# Patient Record
Sex: Male | Born: 1980 | Race: White | Hispanic: No | Marital: Single | Smoking: Never smoker
Health system: Southern US, Community
[De-identification: ages and names within clinical notes are randomized; demographics above are authoritative.]

## PROBLEM LIST (undated history)

## (undated) DIAGNOSIS — E039 Hypothyroidism, unspecified: Secondary | ICD-10-CM

## (undated) DIAGNOSIS — E041 Nontoxic single thyroid nodule: Secondary | ICD-10-CM

## (undated) DIAGNOSIS — Z8619 Personal history of other infectious and parasitic diseases: Secondary | ICD-10-CM

## (undated) DIAGNOSIS — E785 Hyperlipidemia, unspecified: Secondary | ICD-10-CM

## (undated) DIAGNOSIS — T4145XA Adverse effect of unspecified anesthetic, initial encounter: Secondary | ICD-10-CM

## (undated) DIAGNOSIS — E119 Type 2 diabetes mellitus without complications: Secondary | ICD-10-CM

## (undated) DIAGNOSIS — T8859XA Other complications of anesthesia, initial encounter: Secondary | ICD-10-CM

## (undated) DIAGNOSIS — F419 Anxiety disorder, unspecified: Secondary | ICD-10-CM

## (undated) HISTORY — DX: Personal history of other infectious and parasitic diseases: Z86.19

## (undated) HISTORY — DX: Hyperlipidemia, unspecified: E78.5

## (undated) HISTORY — DX: Hypothyroidism, unspecified: E03.9

## (undated) HISTORY — DX: Type 2 diabetes mellitus without complications: E11.9

## (undated) HISTORY — DX: Nontoxic single thyroid nodule: E04.1

---

## 2013-05-05 ENCOUNTER — Ambulatory Visit (INDEPENDENT_AMBULATORY_CARE_PROVIDER_SITE_OTHER): Payer: Managed Care, Other (non HMO) | Admitting: Adult Health

## 2013-05-05 ENCOUNTER — Encounter: Payer: Self-pay | Admitting: Adult Health

## 2013-05-05 ENCOUNTER — Encounter (INDEPENDENT_AMBULATORY_CARE_PROVIDER_SITE_OTHER): Payer: Self-pay

## 2013-05-05 VITALS — BP 106/86 | HR 88 | Temp 98.1°F | Resp 14 | Ht 67.9 in | Wt 213.0 lb

## 2013-05-05 DIAGNOSIS — E785 Hyperlipidemia, unspecified: Secondary | ICD-10-CM

## 2013-05-05 DIAGNOSIS — Z0000 Encounter for general adult medical examination without abnormal findings: Secondary | ICD-10-CM

## 2013-05-05 DIAGNOSIS — R7309 Other abnormal glucose: Secondary | ICD-10-CM

## 2013-05-05 DIAGNOSIS — E039 Hypothyroidism, unspecified: Secondary | ICD-10-CM

## 2013-05-05 DIAGNOSIS — Z8639 Personal history of other endocrine, nutritional and metabolic disease: Secondary | ICD-10-CM | POA: Insufficient documentation

## 2013-05-05 HISTORY — DX: Hyperlipidemia, unspecified: E78.5

## 2013-05-05 LAB — CBC WITH DIFFERENTIAL/PLATELET
BASOS PCT: 0.6 % (ref 0.0–3.0)
Basophils Absolute: 0 10*3/uL (ref 0.0–0.1)
EOS ABS: 0.1 10*3/uL (ref 0.0–0.7)
Eosinophils Relative: 2.2 % (ref 0.0–5.0)
HCT: 46.1 % (ref 39.0–52.0)
Hemoglobin: 15.7 g/dL (ref 13.0–17.0)
LYMPHS ABS: 2.2 10*3/uL (ref 0.7–4.0)
Lymphocytes Relative: 32.5 % (ref 12.0–46.0)
MCHC: 34.1 g/dL (ref 30.0–36.0)
MCV: 87.3 fl (ref 78.0–100.0)
Monocytes Absolute: 0.5 10*3/uL (ref 0.1–1.0)
Monocytes Relative: 8 % (ref 3.0–12.0)
NEUTROS ABS: 3.9 10*3/uL (ref 1.4–7.7)
NEUTROS PCT: 56.7 % (ref 43.0–77.0)
Platelets: 252 10*3/uL (ref 150.0–400.0)
RBC: 5.28 Mil/uL (ref 4.22–5.81)
RDW: 12.3 % (ref 11.5–14.6)
WBC: 6.9 10*3/uL (ref 4.5–10.5)

## 2013-05-05 LAB — HEPATIC FUNCTION PANEL
ALBUMIN: 4.5 g/dL (ref 3.5–5.2)
ALK PHOS: 70 U/L (ref 39–117)
ALT: 121 U/L — ABNORMAL HIGH (ref 0–53)
AST: 69 U/L — AB (ref 0–37)
BILIRUBIN DIRECT: 0.2 mg/dL (ref 0.0–0.3)
TOTAL PROTEIN: 7.2 g/dL (ref 6.0–8.3)
Total Bilirubin: 1.3 mg/dL — ABNORMAL HIGH (ref 0.3–1.2)

## 2013-05-05 LAB — LIPID PANEL
CHOLESTEROL: 232 mg/dL — AB (ref 0–200)
HDL: 36.1 mg/dL — ABNORMAL LOW (ref >39.00)
LDL Cholesterol: 149 mg/dL — ABNORMAL HIGH (ref 0–99)
Total CHOL/HDL Ratio: 6
Triglycerides: 236 mg/dL — ABNORMAL HIGH (ref 0.0–149.0)
VLDL: 47.2 mg/dL — AB (ref 0.0–40.0)

## 2013-05-05 LAB — BASIC METABOLIC PANEL
BUN: 19 mg/dL (ref 6–23)
CHLORIDE: 102 meq/L (ref 96–112)
CO2: 27 mEq/L (ref 19–32)
Calcium: 10.3 mg/dL (ref 8.4–10.5)
Creatinine, Ser: 0.9 mg/dL (ref 0.4–1.5)
GFR: 108.92 mL/min (ref >60.00)
Glucose, Bld: 132 mg/dL — ABNORMAL HIGH (ref 70–99)
Potassium: 4.1 mEq/L (ref 3.5–5.1)
SODIUM: 137 meq/L (ref 135–145)

## 2013-05-05 LAB — TSH: TSH: 1.94 u[IU]/mL (ref 0.35–5.50)

## 2013-05-05 LAB — HEMOGLOBIN A1C: Hgb A1c MFr Bld: 9.6 % — ABNORMAL HIGH (ref 4.6–6.5)

## 2013-05-05 LAB — FOLATE: Folate: >24.8 ng/mL (ref >5.9)

## 2013-05-05 LAB — VITAMIN B12: Vitamin B-12: 403 pg/mL (ref 211–911)

## 2013-05-05 NOTE — Progress Notes (Signed)
Pre visit review using our clinic review tool, if applicable. No additional management support is needed unless otherwise documented below in the visit note. 

## 2013-05-05 NOTE — Progress Notes (Signed)
Patient ID: Eric Hodges, male   DOB: [birth year], 33 y.o.   MRN: [id number]   Subjective:    Patient ID: Eric Hodges, male    DOB: 16-Aug-1980, 33 y.o.   MRN: [id number]  HPI  Pt is a pleasant 33 y/o male who presents to clinic to establish care. He recently moved to this area from [facility]. He reports history of hypothyroidism although he has not taken his medication in quite some time. Also reports history of thyroid nodule last evaluated in August of 2014 with recommendation to evaluate yearly. Previous prescription for levothyroxine was 50 mcg daily. 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 Recently, he is concerned about his blood sugar levels. Reports that when he started working at [employer] they offered a free screening and he found out that his A1c was elevated. Does not recall what the levels were. He has been dieting and exercising. He is fasting to have blood work checked. Also has history of elevated cholesterol in the past.     Past Medical History  Diagnosis Date  . Hypothyroidism   . Depression     Situational - death of father in May 12, 2006. Resolved  . History of chicken pox   . Thyroid nodule     Evaluate yearly - due 08/2013     History reviewed. No pertinent past surgical history.   Family History  Problem Relation Age of Onset  . Hyperlipidemia Mother   . Hypertension Mother      History   Social History  . Marital Status: Single    Spouse Name: N/A    Number of Children: N/A  . Years of Education: N/A   Occupational History  . Not on file.   Social History Main Topics  . Smoking status: Never Smoker   . Smokeless tobacco: Not on file  . Alcohol Use: Yes     Comment: Occasionally  . Drug Use: No  . Sexual Activity: Not on file   Other Topics Concern  . Not on file   Social History Narrative  . No narrative on file     Review of Systems  Constitutional: Negative.   HENT: Negative.   Eyes:  Negative.   Respiratory: Negative.   Cardiovascular: Negative.   Gastrointestinal: Negative.   Endocrine: Negative.   Genitourinary: Negative.   Musculoskeletal: Negative.   Skin: Negative.   Allergic/Immunologic: Negative.   Neurological: Negative.   Hematological: Negative.   Psychiatric/Behavioral: Negative.        Objective:  BP 106/86  Pulse 88  Temp(Src) 98.1 F (36.7 C)  Resp 14  Ht 5' 7.9" (1.725 m)  Wt 213 lb (96.616 kg)  BMI 32.47 kg/m2  SpO2 98%     Physical Exam  Constitutional: He is oriented to person, place, and time. He appears well-developed and well-nourished. No distress.  HENT:  Head: Normocephalic and atraumatic.  Right Ear: External ear normal.  Left Ear: External ear normal.  Nose: Nose normal.  Mouth/Throat: Oropharynx is clear and moist.  Eyes: Conjunctivae and EOM are normal. Pupils are equal, round, and reactive to light.  Neck: Normal range of motion. Neck supple. No tracheal deviation present. No thyromegaly present.  Cardiovascular: Normal rate, regular rhythm, normal heart sounds and intact distal pulses.  Exam reveals no gallop and no friction rub.   No murmur heard. Pulmonary/Chest: Effort normal and breath sounds normal. No respiratory distress. He has no wheezes. He has no rales.  Abdominal: Soft. Bowel sounds  are normal. He exhibits no distension and no mass. There is no tenderness. There is no rebound and no guarding.  Musculoskeletal: Normal range of motion. He exhibits no edema and no tenderness.  Lymphadenopathy:    He has no cervical adenopathy.  Neurological: He is alert and oriented to person, place, and time. He has normal reflexes. No cranial nerve deficit. Coordination normal.  Skin: Skin is warm and dry.  Psychiatric: He has a normal mood and affect. His behavior is normal. Judgment and thought content normal.      Assessment & Plan:   1. Routine general medical examination at a health care facility Normal physical  exam. Check routine labs. Request medical records from previous PCP - Basic metabolic panel - CBC with Differential - Vit D  25 hydroxy (rtn osteoporosis monitoring) - Vitamin B12 - Folate  2. Hypothyroidism Has not been taking his levothyroxine. Check levels. Restart medication.  Hx of thyroid nodule which will need re-evaluation in August 2015.  - TSH  3. HLD (hyperlipidemia) Hx of elevated cholesterol. Check lipids and liver function. Continue to follow - Hepatic function panel - Lipid panel  4. Elevated blood glucose level Check hemoglobin A1c.

## 2013-05-06 LAB — VITAMIN D 25 HYDROXY (VIT D DEFICIENCY, FRACTURES): Vit D, 25-Hydroxy: 23 ng/mL — ABNORMAL LOW (ref 30–89)

## 2013-05-08 ENCOUNTER — Encounter: Payer: Self-pay | Admitting: Adult Health

## 2013-05-08 ENCOUNTER — Other Ambulatory Visit: Payer: Self-pay | Admitting: Adult Health

## 2013-05-08 DIAGNOSIS — R748 Abnormal levels of other serum enzymes: Secondary | ICD-10-CM

## 2013-05-08 MED ORDER — METFORMIN HCL 500 MG PO TABS: ORAL_TABLET | ORAL | Status: DC

## 2013-05-08 MED ORDER — LINAGLIPTIN 5 MG PO TABS: 5.0000 mg | ORAL_TABLET | Freq: Every day | ORAL | Status: DC

## 2013-05-11 ENCOUNTER — Telehealth: Payer: Self-pay | Admitting: Adult Health

## 2013-05-11 NOTE — Telephone Encounter (Signed)
I would encourage him to stay on the metformin. It will also help with weight loss. I want him to return to see me 1 month from his last visit. He needs to have an ultrasound and that way I should have the result to discuss. I also want to recheck his liver panel and glucose level after he has exercised and dieted for 1 month.

## 2013-05-11 NOTE — Telephone Encounter (Signed)
Pt came in to office for glucometer.  Pt had to make nurse visit appt, will come 5/8.  Pt states he was prescribed medication that he has stopped taking and would like to speak with R. Rey about.  Wants to be sure it is okay that he has stopped taking the metformin for now and would like to see what results he gets without the medication.  States he  has lost 12 pounds

## 2013-05-12 NOTE — Telephone Encounter (Signed)
Called patient no answer voicemail left to have him call the office at his earliest convenience to discuss medication.

## 2013-05-12 NOTE — Telephone Encounter (Signed)
Pt actually had not started taking the Metformin yet. He states that he has changed his diet and exercise a lot since last visit, has already lost 12 lbs. Would like to hold off on starting Metformin until after he comes in for his nurse visit and gets his glucometer so he can be checking his sugars at home and seeing his process. Pt is on the phone with Lorriane Shire now scheduling his ultrasound for tomorrow. Pt is aware he needs a 1 month followup with [facility], has not scheduled yet and declined to schedule until after he has his ultrasound done.

## 2013-05-13 ENCOUNTER — Ambulatory Visit: Payer: Self-pay | Admitting: Adult Health

## 2013-05-15 ENCOUNTER — Telehealth: Payer: Self-pay | Admitting: Adult Health

## 2013-05-15 NOTE — Telephone Encounter (Signed)
Results of abdominal ultrasound

## 2013-05-20 ENCOUNTER — Ambulatory Visit (INDEPENDENT_AMBULATORY_CARE_PROVIDER_SITE_OTHER): Payer: Managed Care, Other (non HMO) | Admitting: Adult Health

## 2013-05-20 ENCOUNTER — Ambulatory Visit: Payer: Managed Care, Other (non HMO)

## 2013-05-20 ENCOUNTER — Encounter: Payer: Self-pay | Admitting: Adult Health

## 2013-05-20 VITALS — BP 104/76 | HR 72 | Temp 98.2°F | Resp 12 | Wt 202.8 lb

## 2013-05-20 DIAGNOSIS — E119 Type 2 diabetes mellitus without complications: Secondary | ICD-10-CM

## 2013-05-20 MED ORDER — LINAGLIPTIN 5 MG PO TABS: 5.0000 mg | ORAL_TABLET | Freq: Every day | ORAL | Status: DC

## 2013-05-20 MED ORDER — METFORMIN HCL 500 MG PO TABS
ORAL_TABLET | ORAL | Status: DC
Start: 2013-05-20 — End: 2014-06-04

## 2013-05-20 NOTE — Progress Notes (Signed)
   Subjective:    Patient ID: Eric Hodges, male    DOB: [birth year], 33 y.o.   MRN: [id number]  HPI Pt is a pleasant 33 year old male newly diagnosed with diabetes. I had prescribed metformin 500 mg twice a day and Tradjenta when diagnosed 2 weeks ago. He has not started any of his medications on account that he wanted to discuss these with me further. He had been asked to come to clinic today for instructions on how to use his glucometer as well as to discuss diet and exercise. Patient has been doing a lot of diabetic research on his own. He does not feel he needs any instruction on diet. He has been exercising every day on his elliptical machine. Patient has lost 16 pounds. He was congratulated on this.   Past Medical History  Diagnosis Date  . Hypothyroidism   . Depression     Situational - death of father in 04/30/06. Resolved  . History of chicken pox   . Thyroid nodule     Evaluate yearly - due 08/2013    Review of Systems  Constitutional: Negative.   HENT: Negative.   Eyes: Negative.   Respiratory: Negative.   Cardiovascular: Negative.   Gastrointestinal: Negative.   Endocrine: Negative.   Genitourinary: Negative.   Musculoskeletal: Negative.   Skin: Negative.   Allergic/Immunologic: Negative.   Neurological: Negative.   Hematological: Negative.   Psychiatric/Behavioral: Negative.        Objective:   Physical Exam  Constitutional: He is oriented to person, place, and time. He appears well-developed and well-nourished. No distress.  HENT:  Head: Normocephalic and atraumatic.  Eyes: Conjunctivae and EOM are normal.  Neck: Neck supple.  Cardiovascular: Normal rate and regular rhythm.   Pulmonary/Chest: Effort normal. No respiratory distress.  Musculoskeletal: Normal range of motion.  Neurological: He is alert and oriented to person, place, and time. Coordination normal.  Skin: Skin is warm and dry.  Psychiatric: He has a normal mood and affect. His behavior is normal.  Judgment and thought content normal.      Assessment & Plan:   1. Diabetes mellitus, type 2 Discussed medications at length with pt. He had not started his medication because he was trying to lower his blood sugar level "on his own with diet and exercise". He was congratulated on the 16 lb weight loss and on his exercising regularly. I did explained that he will need medication now to help lower his HgbA1c. He may eventually be able to come off medication but we need to make sure we prevent complications from even starting. Discussed yearly eye and foot exam. Diet, as mentioned above, he feels comfortable with. After discussing dz process and controlling his levels he has agreed to start medication today. Follow up in 1 month. Great than 30 min were spent in face to face communication including education for this newly diagnosed problem.

## 2013-05-20 NOTE — Progress Notes (Signed)
Pre visit review using our clinic review tool, if applicable. No additional management support is needed unless otherwise documented below in the visit note. 

## 2013-05-23 ENCOUNTER — Telehealth: Payer: Self-pay | Admitting: *Deleted

## 2013-05-23 NOTE — Telephone Encounter (Signed)
PA request form for the Tradjenta placed in [facility] box

## 2013-05-26 ENCOUNTER — Telehealth: Payer: Self-pay | Admitting: Adult Health

## 2013-05-26 NOTE — Telephone Encounter (Signed)
Prior auth for tradjenta

## 2013-06-04 ENCOUNTER — Other Ambulatory Visit: Payer: Self-pay | Admitting: Adult Health

## 2013-06-04 MED ORDER — SAXAGLIPTIN HCL 2.5 MG PO TABS: 2.5000 mg | ORAL_TABLET | Freq: Every day | ORAL | Status: DC

## 2013-06-09 ENCOUNTER — Encounter: Payer: Self-pay | Admitting: Adult Health

## 2013-06-21 ENCOUNTER — Ambulatory Visit (INDEPENDENT_AMBULATORY_CARE_PROVIDER_SITE_OTHER): Payer: Managed Care, Other (non HMO) | Admitting: Adult Health

## 2013-06-21 ENCOUNTER — Encounter: Payer: Self-pay | Admitting: Adult Health

## 2013-06-21 VITALS — BP 110/78 | HR 71 | Temp 98.1°F | Resp 14 | Ht 67.0 in | Wt 191.0 lb

## 2013-06-21 DIAGNOSIS — E1165 Type 2 diabetes mellitus with hyperglycemia: Secondary | ICD-10-CM

## 2013-06-21 DIAGNOSIS — IMO0001 Reserved for inherently not codable concepts without codable children: Secondary | ICD-10-CM

## 2013-06-21 DIAGNOSIS — IMO0002 Reserved for concepts with insufficient information to code with codable children: Secondary | ICD-10-CM

## 2013-06-21 NOTE — Progress Notes (Signed)
Pre visit review using our clinic review tool, if applicable. No additional management support is needed unless otherwise documented below in the visit note. 

## 2013-06-21 NOTE — Progress Notes (Signed)
Patient ID: Eric Hodges, male   DOB: 1980/08/08, 33 y.o.   MRN: [id number]   Subjective:    Patient ID: Eric Hodges, male    DOB: [birth year], 33 y.o.   MRN: [id number]  HPI  Pt is a pleasant 34 year old male recently diagnosed with diabetes. Previous hemoglobin A1c 9.6%. He has been taking metformin 500 mg twice a day with meals. Reports diarrhea and stomach upset following a.m. dose. No problems with p.m. dose. Has not picked up onglyza. Insurance initially requested prior authorization on tradjenta and refused request. They would onglyza; however, it took so long that he got tired of going back and forth to the pharmacy.  He has been exercising every day. Watching his diet very closely. Limited carbohydrates. Has lost 30 pounds in 1.5 months. Overall, feeling very well.  Past Medical History  Diagnosis Date  . Hypothyroidism   . Depression     Situational - death of father in 05/03/06. Resolved  . History of chicken pox   . Thyroid nodule     Evaluate yearly - due 08/2013    Current Outpatient Prescriptions on File Prior to Visit  Medication Sig Dispense Refill  . HYDROcodone-acetaminophen (NORCO/VICODIN) 5-325 MG per tablet Take 1 tablet by mouth every 6 (six) hours as needed for moderate pain.      Marland Kitchen levothyroxine (SYNTHROID, LEVOTHROID) 50 MCG tablet Take 50 mcg by mouth daily before breakfast.      . metFORMIN (GLUCOPHAGE) 500 MG tablet Start taking 500 mg with breakfast for 1 week then increase to twice daily with breakfast and dinner  60 tablet  2  . saxagliptin HCl (ONGLYZA) 2.5 MG TABS tablet Take 1 tablet (2.5 mg total) by mouth daily.  30 tablet  6   No current facility-administered medications on file prior to visit.     Review of Systems  Constitutional: Negative.   HENT: Negative.   Eyes: Negative.   Respiratory: Negative.   Cardiovascular: Negative.   Gastrointestinal: Negative.   Endocrine: Negative.   Genitourinary: Negative.   Musculoskeletal: Negative.     Skin: Negative.   Allergic/Immunologic: Negative.   Neurological: Negative.   Hematological: Negative.   Psychiatric/Behavioral: Negative.        Objective:  BP 110/78  Pulse 71  Temp(Src) 98.1 F (36.7 C) (Oral)  Resp 14  Ht 5\' 7"  (1.702 m)  Wt 191 lb (86.637 kg)  BMI 29.91 kg/m2  SpO2 98%   Physical Exam  Constitutional: He is oriented to person, place, and time. He appears well-developed and well-nourished. No distress.  HENT:  Head: Normocephalic and atraumatic.  Eyes: Conjunctivae and EOM are normal.  Cardiovascular: Normal rate, regular rhythm, normal heart sounds and intact distal pulses.  Exam reveals no gallop and no friction rub.   No murmur heard. Pulmonary/Chest: Effort normal and breath sounds normal. No respiratory distress. He has no wheezes. He has no rales.  Musculoskeletal: Normal range of motion.  Neurological: He is alert and oriented to person, place, and time.  Skin: Skin is warm and dry.  Psychiatric: He has a normal mood and affect. His behavior is normal. Judgment and thought content normal.      Assessment & Plan:   1. Diabetes mellitus type II, uncontrolled Check a1c today. Suspect it is down from 9.6%. Congratulated him on 30 lb weight loss. He has been very diligent in controlling his diabetes. Continue to follow. Adjust meds as needed.  - Hemoglobin A1c

## 2013-06-22 ENCOUNTER — Encounter: Payer: Self-pay | Admitting: Adult Health

## 2013-06-22 LAB — HEMOGLOBIN A1C: HEMOGLOBIN A1C: 6.9 % — AB (ref 4.6–6.5)

## 2013-06-23 ENCOUNTER — Other Ambulatory Visit: Payer: Self-pay | Admitting: Adult Health

## 2013-06-23 ENCOUNTER — Telehealth: Payer: Self-pay | Admitting: *Deleted

## 2013-06-23 ENCOUNTER — Other Ambulatory Visit: Payer: Managed Care, Other (non HMO)

## 2013-06-23 DIAGNOSIS — R740 Nonspecific elevation of levels of transaminase and lactic acid dehydrogenase [LDH]: Principal | ICD-10-CM

## 2013-06-23 DIAGNOSIS — R7401 Elevation of levels of liver transaminase levels: Secondary | ICD-10-CM

## 2013-06-23 NOTE — Telephone Encounter (Signed)
Patient wantd to know when he should schedule a follow up appointment. Please advise

## 2013-06-23 NOTE — Telephone Encounter (Signed)
What labs and dx?  

## 2013-06-27 ENCOUNTER — Other Ambulatory Visit: Payer: Managed Care, Other (non HMO)

## 2013-06-28 ENCOUNTER — Other Ambulatory Visit (INDEPENDENT_AMBULATORY_CARE_PROVIDER_SITE_OTHER): Payer: Managed Care, Other (non HMO)

## 2013-06-28 DIAGNOSIS — R740 Nonspecific elevation of levels of transaminase and lactic acid dehydrogenase [LDH]: Secondary | ICD-10-CM

## 2013-06-28 DIAGNOSIS — R7401 Elevation of levels of liver transaminase levels: Secondary | ICD-10-CM

## 2013-06-29 ENCOUNTER — Other Ambulatory Visit: Payer: Managed Care, Other (non HMO)

## 2013-06-30 ENCOUNTER — Encounter: Payer: Self-pay | Admitting: Adult Health

## 2013-06-30 LAB — HEPATIC FUNCTION PANEL
ALBUMIN: 4.8 g/dL (ref 3.5–5.2)
ALT: 43 U/L (ref 0–53)
AST: 29 U/L (ref 0–37)
Alkaline Phosphatase: 61 U/L (ref 39–117)
Bilirubin, Direct: 0.2 mg/dL (ref 0.0–0.3)
TOTAL PROTEIN: 7.5 g/dL (ref 6.0–8.3)
Total Bilirubin: 0.9 mg/dL (ref 0.2–1.2)

## 2014-06-04 ENCOUNTER — Encounter
Admission: EM | Disposition: A | Discharge status: Home / Self Care | Payer: Self-pay | Source: Home / Self Care | Attending: Emergency Medicine

## 2014-06-04 ENCOUNTER — Emergency Department: Payer: 59

## 2014-06-04 ENCOUNTER — Observation Stay: Payer: 59 | Admitting: Anesthesiology

## 2014-06-04 ENCOUNTER — Observation Stay
Admission: EM | Admit: 2014-06-04 | Discharge: 2014-06-05 | Disposition: A | Discharge status: Home / Self Care | Payer: 59 | Attending: Surgery | Admitting: Surgery

## 2014-06-04 ENCOUNTER — Encounter: Payer: Self-pay | Admitting: Emergency Medicine

## 2014-06-04 DIAGNOSIS — K3580 Unspecified acute appendicitis: Secondary | ICD-10-CM

## 2014-06-04 DIAGNOSIS — K353 Acute appendicitis with localized peritonitis: Secondary | ICD-10-CM | POA: Diagnosis not present

## 2014-06-04 DIAGNOSIS — Z809 Family history of malignant neoplasm, unspecified: Secondary | ICD-10-CM | POA: Diagnosis not present

## 2014-06-04 DIAGNOSIS — Z833 Family history of diabetes mellitus: Secondary | ICD-10-CM | POA: Diagnosis not present

## 2014-06-04 DIAGNOSIS — Z8249 Family history of ischemic heart disease and other diseases of the circulatory system: Secondary | ICD-10-CM | POA: Insufficient documentation

## 2014-06-04 HISTORY — PX: APPENDECTOMY: SHX54

## 2014-06-04 HISTORY — PX: LAPAROSCOPIC APPENDECTOMY: SHX408

## 2014-06-04 LAB — CBC WITH DIFFERENTIAL/PLATELET
BASOS ABS: 0.1 10*3/uL (ref 0–0.1)
BASOS PCT: 1 %
EOS ABS: 0.1 10*3/uL (ref 0–0.7)
EOS PCT: 2 %
HCT: 45.6 % (ref 40.0–52.0)
Hemoglobin: 15.5 g/dL (ref 13.0–18.0)
LYMPHS ABS: 1.7 10*3/uL (ref 1.0–3.6)
Lymphocytes Relative: 22 %
MCH: 29.2 pg (ref 26.0–34.0)
MCHC: 34.1 g/dL (ref 32.0–36.0)
MCV: 85.7 fL (ref 80.0–100.0)
MONO ABS: 0.6 10*3/uL (ref 0.2–1.0)
Monocytes Relative: 8 %
NEUTROS ABS: 5.5 10*3/uL (ref 1.4–6.5)
NEUTROS PCT: 67 %
Platelets: 214 10*3/uL (ref 150–440)
RBC: 5.32 MIL/uL (ref 4.40–5.90)
RDW: 12.8 % (ref 11.5–14.5)
WBC: 8 10*3/uL (ref 3.8–10.6)

## 2014-06-04 LAB — URINALYSIS COMPLETE WITH MICROSCOPIC (ARMC ONLY)
BILIRUBIN URINE: NEGATIVE
Bacteria, UA: NONE SEEN
GLUCOSE, UA: NEGATIVE mg/dL
HGB URINE DIPSTICK: NEGATIVE
Ketones, ur: NEGATIVE mg/dL
LEUKOCYTES UA: NEGATIVE
Nitrite: NEGATIVE
PH: 5 (ref 5.0–8.0)
Protein, ur: NEGATIVE mg/dL
SPECIFIC GRAVITY, URINE: 1.021 (ref 1.005–1.030)
Squamous Epithelial / LPF: NONE SEEN

## 2014-06-04 LAB — COMPREHENSIVE METABOLIC PANEL
ALBUMIN: 4.8 g/dL (ref 3.5–5.0)
ALK PHOS: 61 U/L (ref 38–126)
ALT: 32 U/L (ref 17–63)
AST: 28 U/L (ref 15–41)
Anion gap: 7 (ref 5–15)
BUN: 15 mg/dL (ref 6–20)
CO2: 28 mmol/L (ref 22–32)
Calcium: 9.6 mg/dL (ref 8.9–10.3)
Chloride: 106 mmol/L (ref 101–111)
Creatinine, Ser: 0.93 mg/dL (ref 0.61–1.24)
GFR calc Af Amer: >60 mL/min (ref >60)
Glucose, Bld: 123 mg/dL — ABNORMAL HIGH (ref 65–99)
Potassium: 4.1 mmol/L (ref 3.5–5.1)
Sodium: 141 mmol/L (ref 135–145)
TOTAL PROTEIN: 7.9 g/dL (ref 6.5–8.1)
Total Bilirubin: 0.6 mg/dL (ref 0.3–1.2)

## 2014-06-04 LAB — TROPONIN I: Troponin I: <0.03 ng/mL (ref <0.031)

## 2014-06-04 LAB — LIPASE, BLOOD: LIPASE: 60 U/L — AB (ref 22–51)

## 2014-06-04 SURGERY — APPENDECTOMY, LAPAROSCOPIC
Anesthesia: General | Wound class: Contaminated

## 2014-06-04 MED ORDER — IOHEXOL 300 MG/ML  SOLN
100.0000 mL | Freq: Once | INTRAMUSCULAR | Status: AC | PRN
  Administered 2014-06-04: 100 mL via INTRAVENOUS

## 2014-06-04 MED ORDER — GLYCOPYRROLATE 0.2 MG/ML IJ SOLN
INTRAMUSCULAR | Status: DC | PRN
  Administered 2014-06-04: .8 mg via INTRAVENOUS

## 2014-06-04 MED ORDER — LIDOCAINE HCL (CARDIAC) 20 MG/ML IV SOLN
INTRAVENOUS | Status: DC | PRN
  Administered 2014-06-04: 60 mg via INTRAVENOUS

## 2014-06-04 MED ORDER — SODIUM CHLORIDE 0.9 % IR SOLN
Status: DC | PRN
  Administered 2014-06-04: 1000 mL

## 2014-06-04 MED ORDER — IOHEXOL 240 MG/ML SOLN: 25.0000 mL | Freq: Once | INTRAMUSCULAR | Status: DC | PRN

## 2014-06-04 MED ORDER — BUPIVACAINE HCL 0.25 % IJ SOLN
INTRAMUSCULAR | Status: DC | PRN
  Administered 2014-06-04: 30 mL

## 2014-06-04 MED ORDER — ERTAPENEM SODIUM 1 G IJ SOLR
1.0000 g | Freq: Once | INTRAMUSCULAR | Status: AC
  Administered 2014-06-04: 1 g via INTRAVENOUS
  Filled 2014-06-04: qty 1

## 2014-06-04 MED ORDER — FENTANYL CITRATE (PF) 100 MCG/2ML IJ SOLN
INTRAMUSCULAR | Status: AC
  Administered 2014-06-04
  Filled 2014-06-04: qty 2

## 2014-06-04 MED ORDER — PANTOPRAZOLE SODIUM 40 MG IV SOLR
40.0000 mg | Freq: Every day | INTRAVENOUS | Status: DC
  Filled 2014-06-04: qty 40

## 2014-06-04 MED ORDER — PROPOFOL 10 MG/ML IV BOLUS
INTRAVENOUS | Status: DC | PRN
  Administered 2014-06-04: 150 mg via INTRAVENOUS

## 2014-06-04 MED ORDER — HYDROCODONE-ACETAMINOPHEN 5-325 MG PO TABS
1.0000 | ORAL_TABLET | Freq: Four times a day (QID) | ORAL | Status: DC | PRN
  Administered 2014-06-05 (×2): 1 via ORAL
  Filled 2014-06-04 (×2): qty 1

## 2014-06-04 MED ORDER — METFORMIN HCL 500 MG PO TABS: 500.0000 mg | ORAL_TABLET | Freq: Two times a day (BID) | ORAL | Status: DC

## 2014-06-04 MED ORDER — SODIUM CHLORIDE 0.9 % IV SOLN
1.5000 g | Freq: Once | INTRAVENOUS | Status: DC
  Administered 2014-06-04: 1.5 g via INTRAVENOUS
  Filled 2014-06-04: qty 1.5

## 2014-06-04 MED ORDER — SODIUM CHLORIDE 0.9 % IV SOLN
3.0000 g | Freq: Once | INTRAVENOUS | Status: AC
  Administered 2014-06-04: 3 g via INTRAVENOUS

## 2014-06-04 MED ORDER — SODIUM CHLORIDE 0.9 % IV SOLN
INTRAVENOUS | Status: DC
  Administered 2014-06-04 (×2): via INTRAVENOUS

## 2014-06-04 MED ORDER — FENTANYL CITRATE (PF) 100 MCG/2ML IJ SOLN
INTRAMUSCULAR | Status: DC | PRN
  Administered 2014-06-04 (×2): 50 ug via INTRAVENOUS
  Administered 2014-06-04 (×2): 100 ug via INTRAVENOUS

## 2014-06-04 MED ORDER — SUCCINYLCHOLINE CHLORIDE 20 MG/ML IJ SOLN
INTRAMUSCULAR | Status: DC | PRN
  Administered 2014-06-04: 100 mg via INTRAVENOUS

## 2014-06-04 MED ORDER — HYDROMORPHONE HCL 1 MG/ML IJ SOLN
1.0000 mg | INTRAMUSCULAR | Status: DC | PRN
  Administered 2014-06-04 – 2014-06-05 (×3): 1 mg via INTRAVENOUS
  Filled 2014-06-04 (×3): qty 1

## 2014-06-04 MED ORDER — MIDAZOLAM HCL 2 MG/2ML IJ SOLN
INTRAMUSCULAR | Status: DC | PRN
  Administered 2014-06-04: 2 mg via INTRAVENOUS

## 2014-06-04 MED ORDER — SODIUM CHLORIDE 0.9 % IV SOLN
INTRAVENOUS | Status: AC
  Administered 2014-06-04: 3 g via INTRAVENOUS
  Filled 2014-06-04: qty 3

## 2014-06-04 MED ORDER — ROCURONIUM BROMIDE 100 MG/10ML IV SOLN
INTRAVENOUS | Status: DC | PRN
  Administered 2014-06-04: 10 mg via INTRAVENOUS
  Administered 2014-06-04: 45 mg via INTRAVENOUS
  Administered 2014-06-04: 5 mg via INTRAVENOUS

## 2014-06-04 MED ORDER — NEOSTIGMINE METHYLSULFATE 10 MG/10ML IV SOLN
INTRAVENOUS | Status: DC | PRN
  Administered 2014-06-04: 5 mg via INTRAVENOUS

## 2014-06-04 MED ORDER — HYDROMORPHONE HCL 1 MG/ML IJ SOLN: 1.0000 mg | INTRAMUSCULAR | Status: DC | PRN

## 2014-06-04 MED ORDER — ONDANSETRON HCL 4 MG/2ML IJ SOLN
4.0000 mg | Freq: Four times a day (QID) | INTRAMUSCULAR | Status: DC | PRN
  Administered 2014-06-04: 4 mg via INTRAVENOUS

## 2014-06-04 MED ORDER — FENTANYL CITRATE (PF) 100 MCG/2ML IJ SOLN
25.0000 ug | INTRAMUSCULAR | Status: DC | PRN
  Administered 2014-06-04 (×5): 25 ug via INTRAVENOUS

## 2014-06-04 MED ORDER — ONDANSETRON HCL 4 MG/2ML IJ SOLN: 4.0000 mg | Freq: Once | INTRAMUSCULAR | Status: DC | PRN

## 2014-06-04 MED ORDER — ENOXAPARIN SODIUM 40 MG/0.4ML ~~LOC~~ SOLN
40.0000 mg | SUBCUTANEOUS | Status: DC
  Administered 2014-06-05: 40 mg via SUBCUTANEOUS
  Filled 2014-06-04: qty 0.4

## 2014-06-04 MED ORDER — KETOROLAC TROMETHAMINE 30 MG/ML IJ SOLN
INTRAMUSCULAR | Status: DC | PRN
  Administered 2014-06-04: 30 mg via INTRAVENOUS

## 2014-06-04 MED ORDER — LEVOTHYROXINE SODIUM 50 MCG PO TABS: 50.0000 ug | ORAL_TABLET | Freq: Every day | ORAL | Status: DC

## 2014-06-04 SURGICAL SUPPLY — 36 items
BLADE CLIPPER SURG (BLADE) ×2 IMPLANT
BLADE SURG 15 STRL LF DISP TIS (BLADE) ×1 IMPLANT
BLADE SURG 15 STRL SS (BLADE) ×1
CANISTER SUCT 3000ML (MISCELLANEOUS) IMPLANT
CHLORAPREP W/TINT 26ML (MISCELLANEOUS) ×2 IMPLANT
CUTTER LINEAR ENDO 35 ART THIN (STAPLE) ×2 IMPLANT
DRESSING TELFA 4X3 1S ST N-ADH (GAUZE/BANDAGES/DRESSINGS) ×2 IMPLANT
DRSG TEGADERM 2-3/8X2-3/4 SM (GAUZE/BANDAGES/DRESSINGS) ×2 IMPLANT
DRSG TELFA 3X8 NADH (GAUZE/BANDAGES/DRESSINGS) ×2 IMPLANT
GLOVE BIO SURGEON STRL SZ7.5 (GLOVE) ×4 IMPLANT
GLOVE INDICATOR 8.0 STRL GRN (GLOVE) ×4 IMPLANT
GOWN STRL REUS W/ TWL LRG LVL3 (GOWN DISPOSABLE) ×2 IMPLANT
GOWN STRL REUS W/TWL LRG LVL3 (GOWN DISPOSABLE) ×2
GRASPER SUT TROCAR 14GX15 (MISCELLANEOUS) ×2 IMPLANT
IRRIGATION STRYKERFLOW (MISCELLANEOUS) ×1 IMPLANT
IRRIGATOR STRYKERFLOW (MISCELLANEOUS) ×2
IV NS 1000ML (IV SOLUTION) ×2
IV NS 1000ML BAXH (IV SOLUTION) ×2 IMPLANT
KIT RM TURNOVER STRD PROC AR (KITS) ×2 IMPLANT
LIGASURE 5MM LAPAROSCOPIC (INSTRUMENTS) ×2 IMPLANT
NEEDLE FILTER BLUNT 18X 1/2SAF (NEEDLE) ×1
NEEDLE FILTER BLUNT 18X1 1/2 (NEEDLE) ×1 IMPLANT
NEEDLE INSUFFLATION 14GA 120MM (NEEDLE) ×2 IMPLANT
NS IRRIG 500ML POUR BTL (IV SOLUTION) ×2 IMPLANT
PACK LAP CHOLECYSTECTOMY (MISCELLANEOUS) ×2 IMPLANT
PAD GROUND ADULT SPLIT (MISCELLANEOUS) ×2 IMPLANT
POUCH ENDO CATCH 10MM SPEC (MISCELLANEOUS) ×2 IMPLANT
RELOAD CUTTER ETS 35MM STAND (ENDOMECHANICALS) IMPLANT
SCISSORS METZENBAUM CVD 33 (INSTRUMENTS) IMPLANT
SUT ETHILON 5-0 FS-2 18 BLK (SUTURE) ×4 IMPLANT
SUT VIC AB 0 CT2 27 (SUTURE) ×2 IMPLANT
SUT VICRYL 0 AB UR-6 (SUTURE) ×2 IMPLANT
TROCAR XCEL 12X100 BLDLESS (ENDOMECHANICALS) ×2 IMPLANT
TROCAR Z-THREAD FIOS 11X100 BL (TROCAR) ×2 IMPLANT
TROCAR Z-THREAD SLEEVE 11X100 (TROCAR) ×2 IMPLANT
TUBING INSUFFLATOR HI FLOW (MISCELLANEOUS) ×2 IMPLANT

## 2014-06-04 NOTE — Anesthesia Preprocedure Evaluation (Signed)
Anesthesia Evaluation  Patient identified by MRN, date of birth, ID band Patient awake    Reviewed: Allergy & Precautions, NPO status , Patient's Chart, lab work & pertinent test results  Airway Mallampati: II  TM Distance: >3 FB Neck ROM: Full    Dental   Pulmonary          Cardiovascular     Neuro/Psych    GI/Hepatic   Endo/Other  diabetesHypothyroidism (no meds now)   Renal/GU      Musculoskeletal   Abdominal   Peds  Hematology   Anesthesia Other Findings   Reproductive/Obstetrics                             Anesthesia Physical Anesthesia Plan  ASA: II  Anesthesia Plan: General   Post-op Pain Management:    Induction: Intravenous  Airway Management Planned: Oral ETT  Additional Equipment:   Intra-op Plan:   Post-operative Plan:   Informed Consent: I have reviewed the patients History and Physical, chart, labs and discussed the procedure including the risks, benefits and alternatives for the proposed anesthesia with the patient or authorized representative who has indicated his/her understanding and acceptance.     Plan Discussed with:   Anesthesia Plan Comments:         Anesthesia Quick Evaluation

## 2014-06-04 NOTE — H&P (Signed)
CC: Abdominal pain, nausea  HPI:  34 yo M with no significant PMH who presents with 2 days of abdominal pain.  Began diffusely initially, thought it was constipation.  Took laxatives and magnesium citrate and pain moved to RLQ.  + nausea x 1 day.  NPO x 1 day.  + chills.  No fevers, chest pain, shortness of breath, cough, dysuria/hematuria  PMH: None  Allergies: NKDA  MEDS: None  SOCIAL HISTORY: Social Etoh, no tobacco  FH: DM, HTN, CAD, cancer  ROS: Full ros obtained, pertinent positives and negatives as above  Blood pressure 122/83, pulse 73, temperature 98.5 F (36.9 C), temperature source Oral, resp. rate 16, height 5\' 7"  (1.702 m), weight 83.915 kg (185 lb), SpO2 96 %.  GEN: NAD/A&Ox3A HEAD: [place]/AT FACE: no obvious facial trauma, normal external nose and ears EYES: no scleral icterus, no conjunctivitis CHEST: clear to auscultation, moving air well CV: RRR, no MRG EXT: moving all ext well, ROM intact NEURO: CN II-XII intact, sensation intact all 4 ext  LABS: All labs reviewed, significant for WBC 8  CT: CT reviewed, thickened appendix, periappendiceal stranding  A/P 34 yo M with RLQ pain, CT scan concerning for appendicitis.  WBC nl.  Clinical and radiographic appendicitis - have discussed recommendation for appendectomy, the procedure and risks and benefits of procedure. He would like to proceed.

## 2014-06-04 NOTE — ED Provider Notes (Signed)
[facility] Emergency Department Provider Note  ____________________________________________  Time seen: 1:40 PM  I have reviewed the triage vital signs and the nursing notes.   HISTORY  Chief Complaint Abdominal Pain      HPI Eric Hodges. is a 34 y.o. male presents with right upper quadrant/right lower quadrant 8 out of 10 persistent pain described as sharp 3 days. Patient also admits to constipation for which she took a laxative with good results this morning. However pain has persisted. Patient also admits to known history of gallstones. No relationship pain to food intake. Patient denies fever no nausea no vomiting     Past Medical History  Diagnosis Date  . Hypothyroidism   . Depression     Situational - death of father in 2006/05/08. Resolved  . History of chicken pox   . Thyroid nodule     Evaluate yearly - due 08/2013  . Diabetes mellitus without complication     takes no medication    Patient Active Problem List   Diagnosis Date Noted  . Diabetes mellitus, type 2 05/20/2013  . Routine general medical examination at a health care facility 05/05/2013  . Hypothyroidism 05/05/2013  . HLD (hyperlipidemia) 05/05/2013    History reviewed. No pertinent past surgical history.  Current Outpatient Rx  Name  Route  Sig  Dispense  Refill  . HYDROcodone-acetaminophen (NORCO/VICODIN) 5-325 MG per tablet   Oral   Take 1 tablet by mouth every 6 (six) hours as needed for moderate pain.         Marland Kitchen levothyroxine (SYNTHROID, LEVOTHROID) 50 MCG tablet   Oral   Take 50 mcg by mouth daily before breakfast.         . metFORMIN (GLUCOPHAGE) 500 MG tablet      Start taking 500 mg with breakfast for 1 week then increase to twice daily with breakfast and dinner   60 tablet   2   . saxagliptin HCl (ONGLYZA) 2.5 MG TABS tablet   Oral   Take 1 tablet (2.5 mg total) by mouth daily.   30 tablet   6     Allergies Review of patient's allergies  indicates no known allergies.  Family History  Problem Relation Age of Onset  . Hyperlipidemia Mother   . Hypertension Mother     Social History History  Substance Use Topics  . Smoking status: Never Smoker   . Smokeless tobacco: Not on file  . Alcohol Use: Yes     Comment: Occasionally    Review of Systems  Constitutional: Negative for fever. Eyes: Negative for visual changes. ENT: Negative for sore throat. Cardiovascular: Negative for chest pain. Respiratory: Negative for shortness of breath. Gastrointestinal: Positive for abdominal pain. vomiting and diarrhea. Genitourinary: Negative for dysuria. Musculoskeletal: Negative for back pain. Skin: Negative for rash. Neurological: Negative for headaches, focal weakness or numbness.   10-point ROS otherwise negative.  ____________________________________________   PHYSICAL EXAM:  VITAL SIGNS: ED Triage Vitals  Enc Vitals Group     BP 06/04/14 0957 133/83 mmHg     Pulse Rate 06/04/14 0957 100     Resp 06/04/14 0957 20     Temp 06/04/14 0957 98.5 F (36.9 C)     Temp Source 06/04/14 0957 Oral     SpO2 06/04/14 0957 97 %     Weight 06/04/14 0957 185 lb (83.915 kg)     Height 06/04/14 0957 5\' 7"  (1.702 m)     Head Cir --  Peak Flow --      Pain Score 06/04/14 0958 3     Pain Loc --      Pain Edu? --      Excl. in [place]? --     Constitutional: Alert and oriented. Well appearing and in no distress. Eyes: Conjunctivae are normal. PERRL. Normal extraocular movements. ENT   Head: Normocephalic and atraumatic.   Nose: No congestion/rhinnorhea.   Mouth/Throat: Mucous membranes are moist.   Neck: No stridor. Cardiovascular: Normal rate, regular rhythm. Normal and symmetric distal pulses are present in all extremities. No murmurs, rubs, or gallops. Respiratory: Normal respiratory effort without tachypnea nor retractions. Breath sounds are clear and equal bilaterally. No  wheezes/rales/rhonchi. Gastrointestinal: Right lower quadrant pain with palpation. No distention. There is no CVA tenderness. Genitourinary: deferred Musculoskeletal: Nontender with normal range of motion in all extremities. No joint effusions.  No lower extremity tenderness nor edema. Neurologic:  Normal speech and language. No gross focal neurologic deficits are appreciated. Speech is normal.  Skin:  Skin is warm, dry and intact. No rash noted. Psychiatric: Mood and affect are normal. Speech and behavior are normal. Patient exhibits appropriate insight and judgment.  ____________________________________________    LABS (pertinent positives/negatives)  Labs Reviewed  URINALYSIS COMPLETEWITH MICROSCOPIC ([place])  - Abnormal; Notable for the following:    Color, Urine YELLOW (*)    APPearance CLEAR (*)    All other components within normal limits  COMPREHENSIVE METABOLIC PANEL - Abnormal; Notable for the following:    Glucose, Bld 123 (*)    All other components within normal limits  LIPASE, BLOOD - Abnormal; Notable for the following:    Lipase 60 (*)    All other components within normal limits  TROPONIN I  CBC WITH DIFFERENTIAL/PLATELET     ____________________________________________     RADIOLOGY  CT abdomen and pelvis consistent with acute appendicitis discussed with the radiologist.  ____________________________________________     INITIAL IMPRESSION / ASSESSMENT AND PLAN / ED COURSE  Pertinent labs & imaging results that were available during my care of the patient were reviewed by me and considered in my medical decision making (see chart for details).  History of physical exam consistent with acute appendicitis. Confirmed with CT of the abdomen and pelvis. Patient discussed with Dr. Rexene Edison who is currently at bedside evaluating the patient. Unasyn 3 g IV given  ____________________________________________   FINAL CLINICAL IMPRESSION(S) / ED  DIAGNOSES  Final diagnoses:  Acute appendicitis without peritonitis      Gregor Hams, MD 06/04/14 1523

## 2014-06-04 NOTE — ED Notes (Signed)
Patient presents to the ED with slight right upper quadrant pain that started 3-4 days ago along with constipation.  Patient states he took laxatives and has had results this morning but RUQ pain continues to come intermittently.  Patient states pain is very sharp.  Patient reports known gallstone.  Patient states pain is "manageable".  Denies nausea and vomiting.

## 2014-06-04 NOTE — Transfer of Care (Signed)
Immediate Anesthesia Transfer of Care Note  Patient: Eric Hodges.  Procedure(s) Performed: Procedure(s): APPENDECTOMY LAPAROSCOPIC (N/A)  Patient Location: PACU  Anesthesia Type:General  Level of Consciousness: awake and patient cooperative  Airway & Oxygen Therapy: Patient Spontanous Breathing and Patient connected to face mask oxygen  Post-op Assessment: Report given to RN and Post -op Vital signs reviewed and stable  Post vital signs: Reviewed and stable  Last Vitals:  Filed Vitals:   06/04/14 1743  BP: 119/77  Pulse: 87  Temp:   Resp: 16    Complications: No apparent anesthesia complications

## 2014-06-04 NOTE — Op Note (Signed)
06/04/2014  9:44 PM  PATIENT:  Eric Hodges.  34 y.o. male  PRE-OPERATIVE DIAGNOSIS:  APPENDICITIS   POST-OPERATIVE DIAGNOSIS:  APPENDICITIS   PROCEDURE:  Procedure(s): APPENDECTOMY LAPAROSCOPIC (N/A)  SURGEON:  Surgeon(s) and Role:    * Dia Crawford III, MD - Primary     ANESTHESIA:   general  EBL:  Total I/O In: 300 [I.V.:300] Out: -    DRAINS: none   LOCAL MEDICATIONS USED:  BUPIVICAINE    DISPOSITION OF SPECIMEN:  PATHOLOGY   DICTATION: With the patient supine position after induction of appropriate general anesthesia the patient's abdomen was prepped with ChloraPrep and draped sterile towels. The patient was placed in the headdown feet up position. Small infraumbilical incision was made in the standard fashion carried down bluntly through the subcutaneous tissue. A varies needle was used to cannulate peritoneal cavity. CO2 was insufflated to appropriate pressure measurements. When approximately 2 and half liters of CO2 were instilled a varies needle was withdrawn and an 11 mm cannula inserted without difficulty. Intraperitoneal position was confirmed and CO2 was reinsufflated.  The abdomen was then examined visually. There did not appear to be any evidence of significant fluid. The bladder was decompressed. No hernias were identified. The liver appeared to be at unremarkable. The right colon was adherent to the right abdominal sidewall with multiple adhesions.  Left lower quadrant transverse incision was made below an 11 mm port inserted under direct vision. A suprapubic incision was made and a 12 mm port inserted under direct vision. Dissection was then carried out at cecum. Typical appendix was quite distended and inflamed. There was evidence of suppurative appendicitis. However the base appendix could not be visualized as it appeared to be retrocecal in the cecum was stuck to the lateral sidewall. The LigaSure apparatus was brought to the table and the cecum dissected  off the abdominal sidewall. The base appendix was then identified. There were multiple adhesions to the appendix. Impression appeared to be one of previous inflammatory reaction in the same area. The mesial appendix was dissected with the LigaSure apparatus back to the base of the appendix. The appendix was then amputated using a single application of an Endo GIA stapling device carrying a blue load.  The appendix was captured in an Endo Catch apparatus and removed without difficulty. The area was then copiously suctioned irrigated with 2 L of warm saline solution. Suprapubic and left lower quadrant incisions were closed with figure-of-eight sutures of 0 Vicryl using the suture passer. The abdomen was then desufflated. Remaining port removed without difficulty. Skin incisions were closed with 5-0 nylon. The area was infiltrated with 0.25% Marcaine for postoperative pain control. Sterile dressings were applied. The patient was then returned to the recovery room having tolerated the procedure well. Sponge instrument needle count were correct 2 in the operating room.  PLAN OF CARE: Admit for overnight observation  PATIENT DISPOSITION:  PACU - hemodynamically stable.   Dia Crawford, III, MD

## 2014-06-04 NOTE — OR Nursing (Signed)
Dr. Ely at bedside.

## 2014-06-04 NOTE — Anesthesia Postprocedure Evaluation (Signed)
  Anesthesia Post-op Note  Patient: Eric Hodges.  Procedure(s) Performed: Procedure(s): APPENDECTOMY LAPAROSCOPIC (N/A)  Anesthesia type:General  Patient location: PACU  Post pain: Pain level controlled  Post assessment: Post-op Vital signs reviewed, Patient's Cardiovascular Status Stable, Respiratory Function Stable, Patent Airway and No signs of Nausea or vomiting  Post vital signs: Reviewed and stable  Last Vitals:  Filed Vitals:   06/04/14 2215  BP: 114/78  Pulse: 59  Temp:   Resp: 10    Level of consciousness: awake, alert  and patient cooperative  Complications: No apparent anesthesia complications

## 2014-06-05 ENCOUNTER — Encounter: Payer: Self-pay | Admitting: Surgery

## 2014-06-05 LAB — CBC
HCT: 41.7 % (ref 40.0–52.0)
Hemoglobin: 13.9 g/dL (ref 13.0–18.0)
MCH: 29.1 pg (ref 26.0–34.0)
MCHC: 33.2 g/dL (ref 32.0–36.0)
MCV: 87.6 fL (ref 80.0–100.0)
Platelets: 198 10*3/uL (ref 150–440)
RBC: 4.76 MIL/uL (ref 4.40–5.90)
RDW: 13.2 % (ref 11.5–14.5)
WBC: 11.2 10*3/uL — AB (ref 3.8–10.6)

## 2014-06-05 LAB — CREATININE, SERUM
CREATININE: 0.89 mg/dL (ref 0.61–1.24)
GFR calc Af Amer: >60 mL/min (ref >60)

## 2014-06-05 MED ORDER — HEPARIN SODIUM (PORCINE) 5000 UNIT/ML IJ SOLN
INTRAMUSCULAR | Status: AC
  Filled 2014-06-05: qty 1

## 2014-06-05 MED ORDER — HYDROCODONE-ACETAMINOPHEN 5-325 MG PO TABS: 1.0000 | ORAL_TABLET | Freq: Four times a day (QID) | ORAL | Status: DC | PRN

## 2014-06-05 MED ORDER — BUPIVACAINE HCL (PF) 0.25 % IJ SOLN
INTRAMUSCULAR | Status: AC
  Filled 2014-06-05: qty 30

## 2014-06-05 MED ORDER — METFORMIN HCL 500 MG PO TABS: 500.0000 mg | ORAL_TABLET | Freq: Two times a day (BID) | ORAL | Status: DC

## 2014-06-05 NOTE — Progress Notes (Signed)
Pt A and O x 4. VSS. Pt had trouble voiding this AM but was able to do so. Pt tolerating diet well. Complaints of pain with meds given to control. No other complaints. Pt had IV removed and prescriptions given. Pt given discharge instructions and voiced that he understood with no other questions. Pt left via wheelchair with aide.

## 2014-06-05 NOTE — Discharge Summary (Signed)
Physician Discharge Summary  Patient ID: Tinsley Lomas. MRN: [id number] DOB/AGE: 1980-06-14 34 y.o.  Admit date: 06/04/2014 Discharge date: 06/05/2014  Admission Diagnoses:  Discharge Diagnoses:  Active Problems:   Appendicitis   Discharged Condition: stable and improved  Hospital Course: Admitted following laparoscopic appendectomy.  Did well.  Voided ok, tolerated regular diet and had good pain control.   Treatments: laparoscopic appendectomy  Discharge Exam: Blood pressure 111/72, pulse 68, temperature 97.9 F (36.6 C), temperature source Oral, resp. rate 18, height 5' 7.5" (1.715 m), weight 89.812 kg (198 lb), SpO2 96 %.   Disposition: 01-Home or Self Care   Signed: Sherri Rad 06/05/2014, 6:37 PM

## 2014-06-05 NOTE — Discharge Instructions (Signed)
Laparoscopic Appendectomy  Care After    These instructions give you information on caring for yourself after your procedure. Your doctor may also give you more specific instructions. Call your doctor if you have any problems or questions after your procedure.  HOME CARE  Do not drive while taking pain medicine (narcotics).  Take medicine (stool softener) if you cannot poop (constipated).  Change your bandages (dressings) as told by your doctor.  Keep your wounds clean and dry. Wash the wounds gently with soap and water. Gently pat the wounds dry with a clean towel.  Do not take baths, swim, or use hot tubs for 10 days, or as told by your doctor.  Only take medicine as told by your doctor.  Continue your normal diet as told by your doctor.  Do not lift more than 10 pounds (4.5 kilograms) or play contact sports for 3 weeks, or as told by your doctor.  Slowly increase your activity.  Take deep breaths to avoid a lung infection (pneumonia). GET HELP RIGHT AWAY IF:  You have a fever >101 You have a rash.  You have trouble breathing or sharp chest pain.  You have a reaction to the medicine you are taking.  Your wound is red, puffy (swollen), or painful.  You have yellowish-white fluid (pus) coming from the wound.  You have fluid coming from the wound for longer than 1 day.  You notice a bad smell coming from the wound or bandage.  Your wound breaks open after stitches (sutures) or staples are removed.  You have pain in the shoulders or shoulder blades.   You are short of breath.  You feel sick to your stomach (nauseous) or throw up (vomit).  MAKE SURE YOU:  Understand these instructions.  Will watch your condition.  Will get help right away if you are not doing well or get worse.

## 2014-06-05 NOTE — Progress Notes (Signed)
1 Day Post-Op   Subjective:  Alert awake, moderate pain control no nausea no shortness of breath  Vital signs in last 24 hours: Temp:  [97.4 F (36.3 C)-98.5 F (36.9 C)] 97.5 F (36.4 C) (05/23 0526) Pulse Rate:  [58-110] 87 (05/23 0526) Resp:  [10-20] 18 (05/23 0526) BP: (104-145)/(56-95) 118/75 mmHg (05/23 0526) SpO2:  [93 %-100 %] 94 % (05/23 0526) Weight:  [83.915 kg (185 lb)-89.812 kg (198 lb)] 89.812 kg (198 lb) (05/22 2245)    Intake/Output from previous day: 05/22 0701 - 05/23 0700 In: 1646.7 [P.O.:120; I.V.:1526.7] Out: 20 [Blood:20]  GI: No significant drainage, mild distention, good bowel sounds, incisional pain  Lab Results:  CBC  Recent Labs  06/04/14 1014 06/05/14 0442  WBC 8.0 11.2*  HGB 15.5 13.9  HCT 45.6 41.7  PLT 214 198   CMP     Component Value Date/Time   NA 141 06/04/2014 1014   K 4.1 06/04/2014 1014   CL 106 06/04/2014 1014   CO2 28 06/04/2014 1014   GLUCOSE 123* 06/04/2014 1014   BUN 15 06/04/2014 1014   CREATININE 0.89 06/05/2014 0442   CALCIUM 9.6 06/04/2014 1014   PROT 7.9 06/04/2014 1014   ALBUMIN 4.8 06/04/2014 1014   AST 28 06/04/2014 1014   ALT 32 06/04/2014 1014   ALKPHOS 61 06/04/2014 1014   BILITOT 0.6 06/04/2014 1014   GFRNONAA >60 06/05/2014 0442   GFRAA >60 06/05/2014 0442   PT/INR No results for input(s): LABPROT, INR in the last 72 hours.  Studies/Results: Ct Abdomen Pelvis W Contrast  06/04/2014   CLINICAL DATA:  Four day history of right upper and right lower quadrant abdominal pain and constipation. Current history of cholelithiasis.  EXAM: CT ABDOMEN AND PELVIS WITH CONTRAST  TECHNIQUE: Multidetector CT imaging of the abdomen and pelvis was performed using the standard protocol following bolus administration of intravenous contrast.  CONTRAST:  153mL OMNIPAQUE IOHEXOL 300 MG/ML IV. Oral contrast was also administered.  COMPARISON:  No prior CT.  Abdominal ultrasound 05/13/2013.  FINDINGS: Liver:  Normal in size  and appearance.  Biliary: Gallstone demonstrated on the prior ultrasound is not visible on the CT as it is not calcified. No evidence of cholecystitis. No biliary ductal dilation.  Spleen:  Normal in size and appearance.  Pancreas:  Normal in appearance.  No pancreatic ductal dilation.  Adrenal glands:  Normal in appearance.  Genitourinary: Both kidneys normal in size and appearance. No evidence of urinary tract calculi. Normal-appearing urinary bladder.  Prostate gland and seminal vesicles normal in size and appearance for age.  Gastrointestinal: Normal appearing stomach and small bowel. Liquid stool throughout the relatively decompressed colon from cecum to rectum. No evidence of colonic wall thickening or pericolonic inflammation to suggest colitis. Appendicolith in the proximal portion of the appendix with dilation of the appendix distal to the appendicolith up to approximately 13 mm diameter. Mild periappendiceal inflammation. No abnormal fluid collection. No extraluminal gas.  Vascular:  No visible aortoiliofemoral atherosclerosis.  Lymphatic:  No pathologic lymphadenopathy in the abdomen or pelvis.  Ascites: Absent.  Musculoskeletal: Regional skeleton intact.  Other findings: None.  Visualized lower thorax: Heart size normal.  Lung bases clear.  IMPRESSION: 1. Acute appendicitis.  No evidence very appendiceal abscess. 2. Liquid stool throughout normal-appearing decompressed colon. 3. Otherwise normal examination. The gallstone identified on the prior abdominal ultrasound is not visible on CT as it is not calcified. I telephoned these results at the time of interpretation on 06/04/2014 at  2:45 pm to Dr. Marjean Donna, who verbally acknowledged these results.   Electronically Signed   By: Evangeline Dakin M.D.   On: 06/04/2014 14:47    Assessment/Plan: Doing well postop. We'll advance diet and activity. Discussed discharge instructions with him in detail. We will review his progress later today.

## 2014-06-07 LAB — SURGICAL PATHOLOGY

## 2014-06-14 ENCOUNTER — Ambulatory Visit: Payer: Self-pay | Admitting: Surgery

## 2014-06-14 ENCOUNTER — Telehealth: Payer: Self-pay | Admitting: Surgery

## 2014-06-14 NOTE — Telephone Encounter (Signed)
Called pt to reschedule appt for 06/14/14 with Dr Marina Gravel due to Dr Birds son having an emergency. Pt stated that he needs to be released for light duty today or tomorrow. 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 He has no remaining time to take off of work.

## 2014-06-14 NOTE — Telephone Encounter (Signed)
Spoke with patient at 1220pm and explained that I could only put him on light duty until rescheduled appt which will be in [place] on 06/20/14 at 0915, pt is aware of this appt. Disability form was filled out at this time and will be faxed to patient's employer per his request. Pt verbalizes understanding of future appt and light duty.

## 2014-06-20 ENCOUNTER — Ambulatory Visit: Payer: Self-pay | Admitting: Surgery

## 2014-06-21 ENCOUNTER — Encounter: Payer: Self-pay | Admitting: Surgery

## 2014-06-21 ENCOUNTER — Ambulatory Visit (INDEPENDENT_AMBULATORY_CARE_PROVIDER_SITE_OTHER): Payer: 59 | Admitting: Surgery

## 2014-06-21 VITALS — BP 122/83 | HR 74 | Temp 98.7°F | Ht 67.0 in | Wt 194.6 lb

## 2014-06-21 DIAGNOSIS — K3580 Unspecified acute appendicitis: Secondary | ICD-10-CM

## 2014-06-21 NOTE — Progress Notes (Signed)
Outpatient Surgical Follow Up  06/21/2014  Eric Hodges. is an 34 y.o. male.   Chief Complaint  Patient presents with  . Routine Post Op    appendectomy    HPI: He returns for follow-up after his appendectomy. He is doing quite well with no major medical problems. He does not have any significant GI symptoms.  Past Medical History  Diagnosis Date  . Hypothyroidism   . Depression     Situational - death of father in 04-24-2006. Resolved  . History of chicken pox   . Thyroid nodule     Evaluate yearly - due 08/2013  . Diabetes mellitus without complication     takes no medication    Past Surgical History  Procedure Laterality Date  . Appendectomy  06-04-14    Dr Pat Patrick  . Laparoscopic appendectomy N/A 06/04/2014    Procedure: APPENDECTOMY LAPAROSCOPIC;  Surgeon: Dia Crawford III, MD;  Location: [facility] ORS;  Service: General;  Laterality: N/A;    Family History  Problem Relation Age of Onset  . Hyperlipidemia Mother   . Hypertension Mother     Social History:  reports that he has never smoked. He does not have any smokeless tobacco history on file. He reports that he drinks alcohol. He reports that he does not use illicit drugs.  Allergies: No Known Allergies  Medications reviewed.    ROS no new complaints noted.    BP 122/83 mmHg  Pulse 74  Temp(Src) 98.7 F (37.1 C) (Oral)  Ht 5\' 7"  (1.702 m)  Wt 194 lb 9.6 oz (88.27 kg)  BMI 30.47 kg/m2  Physical Exam his wound looks good. There is no sign of any infection. Sutures were removed.     No results found for this or any previous visit (from the past 48 hour(s)). No results found.  Assessment/Plan:  1. Acute appendicitis, unspecified acute appendicitis type He is doing well overall. We will plan to see him back in our office as necessary. We've liberalized his restrictions today. We will allow him to return to normal activity in approximately a month. He was in agreement.     Dia Crawford  III  06/21/2014,negative

## 2014-06-21 NOTE — Patient Instructions (Signed)
Call us if you need us

## 2014-07-14 ENCOUNTER — Ambulatory Visit
Admission: EM | Admit: 2014-07-14 | Discharge: 2014-07-14 | Disposition: A | Discharge status: Home / Self Care | Payer: 59 | Attending: Family Medicine | Admitting: Family Medicine

## 2014-07-14 DIAGNOSIS — W57XXXA Bitten or stung by nonvenomous insect and other nonvenomous arthropods, initial encounter: Secondary | ICD-10-CM

## 2014-07-14 DIAGNOSIS — T148 Other injury of unspecified body region: Secondary | ICD-10-CM | POA: Diagnosis not present

## 2014-07-14 MED ORDER — DOXYCYCLINE HYCLATE 100 MG PO CAPS: 100.0000 mg | ORAL_CAPSULE | Freq: Two times a day (BID) | ORAL | Status: DC

## 2014-07-14 NOTE — ED Provider Notes (Signed)
Patient presents today with symptoms of tick bite along left posterior shoulder/axillary area. Patient states that he had the tick bite on Tuesday remove the tick and then started to notice a red area that was getting more tender to touch and larger. He denies any fever, chills, headache, nausea, vomiting. He describes a clear fluid at times coming from the area. He denies any history of Lyme's disease [place] spotted fever. There is slight extension of the redness that has brought the patient in today.  Review of systems negative except mentioned above Vitals as per chart  GENERAL: NAD HEENT: no pharyngeal erythema, no exudate, no erythema of TMs, no cervical LAD RESP: CTA B CARD: RRR SKIN: There is a quarter sized area of erythematous and induration to the left posterior shoulder/axillary area, there is some erythema extending outside of this induration, no discharge expressed from the area, mild tenderness of the area, no other similar lesions on the body noted NEURO: CN II-XII groslly intact  A/P: Tick Bite/Skin Infection-patient given prescription for doxycycline, discussed warm compresses, seek medical attention if symptoms persist or worsen as discussed.   Paulina Fusi, MD 07/14/14 2017

## 2014-07-14 NOTE — ED Notes (Signed)
Found a tick left posterior axilla Tuesday. Brought tick in-was engorged per patient. Has red area "that is spredding"

## 2014-07-31 ENCOUNTER — Other Ambulatory Visit
Admission: RE | Admit: 2014-07-31 | Discharge: 2014-07-31 | Disposition: A | Discharge status: Home / Self Care | Payer: 59 | Source: Ambulatory Visit | Attending: Emergency Medicine | Admitting: Emergency Medicine

## 2014-07-31 DIAGNOSIS — R5383 Other fatigue: Secondary | ICD-10-CM | POA: Insufficient documentation

## 2014-07-31 LAB — CBC WITH DIFFERENTIAL/PLATELET
BASOS PCT: 1 %
Basophils Absolute: 0.1 10*3/uL (ref 0–0.1)
Eosinophils Absolute: 0.2 10*3/uL (ref 0–0.7)
Eosinophils Relative: 3 %
HEMATOCRIT: 43.5 % (ref 40.0–52.0)
Hemoglobin: 15.3 g/dL (ref 13.0–18.0)
Lymphocytes Relative: 33 %
Lymphs Abs: 2.3 10*3/uL (ref 1.0–3.6)
MCH: 29.8 pg (ref 26.0–34.0)
MCHC: 35.2 g/dL (ref 32.0–36.0)
MCV: 84.6 fL (ref 80.0–100.0)
Monocytes Absolute: 0.5 10*3/uL (ref 0.2–1.0)
Monocytes Relative: 8 %
NEUTROS PCT: 55 %
Neutro Abs: 3.8 10*3/uL (ref 1.4–6.5)
Platelets: 234 10*3/uL (ref 150–440)
RBC: 5.14 MIL/uL (ref 4.40–5.90)
RDW: 13.1 % (ref 11.5–14.5)
WBC: 6.8 10*3/uL (ref 3.8–10.6)

## 2014-07-31 LAB — COMPREHENSIVE METABOLIC PANEL
ALK PHOS: 63 U/L (ref 38–126)
ALT: 25 U/L (ref 17–63)
AST: 23 U/L (ref 15–41)
Albumin: 4.4 g/dL (ref 3.5–5.0)
Anion gap: UNDETERMINED (ref 5–15)
BUN: 21 mg/dL — AB (ref 6–20)
CALCIUM: 9.2 mg/dL (ref 8.9–10.3)
CHLORIDE: 106 mmol/L (ref 101–111)
CO2: 27 mmol/L (ref 22–32)
CREATININE: 0.94 mg/dL (ref 0.61–1.24)
GFR calc Af Amer: >60 mL/min (ref >60)
GFR calc non Af Amer: >60 mL/min (ref >60)
Glucose, Bld: 98 mg/dL (ref 65–99)
Potassium: 3.9 mmol/L (ref 3.5–5.1)
Sodium: UNDETERMINED mmol/L (ref 135–145)
Total Bilirubin: 0.2 mg/dL — ABNORMAL LOW (ref 0.3–1.2)
Total Protein: UNDETERMINED g/dL (ref 6.5–8.1)

## 2014-07-31 LAB — TSH: TSH: 2.101 u[IU]/mL (ref 0.350–4.500)

## 2014-07-31 LAB — SEDIMENTATION RATE: Sed Rate: 5 mm/hr (ref 0–15)

## 2014-08-01 LAB — ROCKY MTN SPOTTED FVR ABS PNL(IGG+IGM)
RMSF IGM: 0.3 {index} (ref 0.00–0.89)
RMSF IgG: NEGATIVE

## 2014-08-01 LAB — B. BURGDORFI ANTIBODIES: B burgdorferi Ab IgG+IgM: <0.91 {ISR} (ref 0.00–0.90)

## 2014-11-07 ENCOUNTER — Ambulatory Visit: Payer: Self-pay | Admitting: Physician Assistant

## 2015-02-02 ENCOUNTER — Ambulatory Visit (INDEPENDENT_AMBULATORY_CARE_PROVIDER_SITE_OTHER): Payer: 59 | Admitting: Family Medicine

## 2015-02-02 ENCOUNTER — Encounter: Payer: Self-pay | Admitting: Family Medicine

## 2015-02-02 DIAGNOSIS — Z130 Encounter for screening for diseases of the blood and blood-forming organs and certain disorders involving the immune mechanism: Secondary | ICD-10-CM

## 2015-02-02 DIAGNOSIS — R1011 Right upper quadrant pain: Secondary | ICD-10-CM

## 2015-02-02 DIAGNOSIS — E1165 Type 2 diabetes mellitus with hyperglycemia: Secondary | ICD-10-CM

## 2015-02-02 DIAGNOSIS — E119 Type 2 diabetes mellitus without complications: Secondary | ICD-10-CM

## 2015-02-02 DIAGNOSIS — E785 Hyperlipidemia, unspecified: Secondary | ICD-10-CM | POA: Diagnosis not present

## 2015-02-02 DIAGNOSIS — Z0000 Encounter for general adult medical examination without abnormal findings: Secondary | ICD-10-CM | POA: Diagnosis not present

## 2015-02-02 DIAGNOSIS — Z23 Encounter for immunization: Secondary | ICD-10-CM

## 2015-02-02 DIAGNOSIS — Z8639 Personal history of other endocrine, nutritional and metabolic disease: Secondary | ICD-10-CM

## 2015-02-02 DIAGNOSIS — K8020 Calculus of gallbladder without cholecystitis without obstruction: Secondary | ICD-10-CM | POA: Diagnosis not present

## 2015-02-02 LAB — COMPREHENSIVE METABOLIC PANEL
ALBUMIN: 4.8 g/dL (ref 3.5–5.2)
ALT: 33 U/L (ref 0–53)
AST: 24 U/L (ref 0–37)
Alkaline Phosphatase: 62 U/L (ref 39–117)
BUN: 18 mg/dL (ref 6–23)
CALCIUM: 10 mg/dL (ref 8.4–10.5)
CO2: 29 mEq/L (ref 19–32)
Chloride: 104 mEq/L (ref 96–112)
Creatinine, Ser: 0.93 mg/dL (ref 0.40–1.50)
GFR: 98.48 mL/min (ref >60.00)
Glucose, Bld: 82 mg/dL (ref 70–99)
Potassium: 4.3 mEq/L (ref 3.5–5.1)
Sodium: 140 mEq/L (ref 135–145)
TOTAL PROTEIN: 7.4 g/dL (ref 6.0–8.3)
Total Bilirubin: 0.6 mg/dL (ref 0.2–1.2)

## 2015-02-02 LAB — LIPID PANEL
CHOL/HDL RATIO: 6
Cholesterol: 230 mg/dL — ABNORMAL HIGH (ref 0–200)
HDL: 38.5 mg/dL — AB (ref >39.00)
NonHDL: 191.23
Triglycerides: 328 mg/dL — ABNORMAL HIGH (ref 0.0–149.0)
VLDL: 65.6 mg/dL — AB (ref 0.0–40.0)

## 2015-02-02 LAB — LDL CHOLESTEROL, DIRECT: Direct LDL: 145 mg/dL

## 2015-02-02 LAB — CBC
HCT: 45.6 % (ref 39.0–52.0)
HEMOGLOBIN: 15.3 g/dL (ref 13.0–17.0)
MCHC: 33.5 g/dL (ref 30.0–36.0)
MCV: 86.2 fl (ref 78.0–100.0)
PLATELETS: 260 10*3/uL (ref 150.0–400.0)
RBC: 5.29 Mil/uL (ref 4.22–5.81)
RDW: 13.4 % (ref 11.5–15.5)
WBC: 6.8 10*3/uL (ref 4.0–10.5)

## 2015-02-02 LAB — MICROALBUMIN / CREATININE URINE RATIO
Creatinine,U: 158.8 mg/dL
Microalb Creat Ratio: 0.4 mg/g (ref 0.0–30.0)
Microalb, Ur: <0.7 mg/dL (ref 0.0–1.9)

## 2015-02-02 LAB — HEMOGLOBIN A1C: HEMOGLOBIN A1C: 6 % (ref 4.6–6.5)

## 2015-02-02 NOTE — Assessment & Plan Note (Signed)
Unclear of control. No meds currently. Labs including A1C today.

## 2015-02-02 NOTE — Assessment & Plan Note (Signed)
Pneumovax given today. Foot exam today. Labs - see orders. Microalbumin today.  Reports that Tdap is up to date but doesn't know when it was given in the last 10 years.  Encouraged to follow up with [facility] for yearly exam.

## 2015-02-02 NOTE — Progress Notes (Signed)
Pre visit review using our clinic review tool, if applicable. No additional management support is needed unless otherwise documented below in the visit note. 

## 2015-02-02 NOTE — Assessment & Plan Note (Signed)
New problem (to me). Patient with worsening RUQ pain and known cholelithiasis. Referral to Gen surg (for surgery). Declined pain medication. Advised PRN Tylenol/Ibuprofen. Labs today.

## 2015-02-02 NOTE — Progress Notes (Signed)
Subjective:  Patient ID: Eric Sevin., male    DOB: February 09, 1980  Age: 35 y.o. MRN: [id number]  CC: Establish care with me (previously a patient of Raquel Ray), Abdominal pain/GB issue, DM-2  HPI Eric Demaree. is a 35 y.o. male presents to the clinic today to establish with me. He has concerns/issues as outlined below.  Abdominal pain   Patient reports that he's had long-standing right upper quadrant pain.  He has had workup including ultrasound previously which showed gallstones.  He states that his pain has been worsening as of recent. He said several attacks were he's had severe pain in association shortness of breath.  Exacerbated by fatty meals.  No known relieving factors.  Patient is concerned and would like to know how to proceed from here.  No associated fevers or chills. No current abdominal pain. No associated nausea or vomiting.  DM-2  Last hemoglobin A1c was 6.9 in June 2015.  He is not currently taking any medications nor is he checking his blood sugars.  Preventative Healthcare  Immunizations  Tetanus - he states that he's had this in the past 10 years but is unclear when.  Pneumococcal - pneumococcal vaccine today as patient is a candidate given diabetes.  Flu - up-to-date.  Labs: In need of labs today. See orders.  PMH, Surgical Hx, Family Hx, Social History reviewed and updated as below.  Past Medical History  Diagnosis Date  . Hypothyroidism   . History of chicken pox   . Thyroid nodule   . DM type 2 (diabetes mellitus, type 2) ([place])    Past Surgical History  Procedure Laterality Date  . Appendectomy  06-04-14    Dr Pat Patrick  . Laparoscopic appendectomy N/A 06/04/2014    Procedure: APPENDECTOMY LAPAROSCOPIC;  Surgeon: Dia Crawford III, MD;  Location: [facility] ORS;  Service: General;  Laterality: N/A;   Family History  Problem Relation Age of Onset  . Hyperlipidemia Mother   . Hypertension Mother   . Cancer Father    Social History    Substance Use Topics  . Smoking status: Never Smoker   . Smokeless tobacco: Never Used  . Alcohol Use: 1.2 oz/week    2 Standard drinks or equivalent per week     Comment: Occasionally   Review of Systems  Constitutional: Negative.   Gastrointestinal: Positive for abdominal pain. Negative for nausea, vomiting and diarrhea.  All other systems reviewed and are negative.   Objective:   Today's Vitals: BP 110/86 mmHg  Pulse 102  Temp(Src) 98.5 F (36.9 C) (Oral)  Ht 5' 6.5" (1.689 m)  Wt 199 lb 8 oz (90.493 kg)  BMI 31.72 kg/m2  SpO2 97%  Physical Exam  Constitutional: He is oriented to person, place, and time. He appears well-developed. No distress.  HENT:  Head: Normocephalic and atraumatic.  Eyes: Conjunctivae are normal.  Cardiovascular: Regular rhythm.  Tachycardia present.   Pulmonary/Chest: Breath sounds normal. No respiratory distress. He has no wheezes. He has no rales.  Abdominal: Soft. He exhibits no distension. There is no tenderness. There is no rebound and no guarding.  Musculoskeletal: Normal range of motion. He exhibits no edema.  Neurological: He is alert and oriented to person, place, and time.  Skin: Skin is warm and dry. No rash noted.  Psychiatric: He has a normal mood and affect.  Vitals reviewed.  Diabetic Foot Check -  Appearance - no lesions, ulcers or calluses Skin - no unusual pallor or redness Monofilament  testing -  Right - Great toe, medial, central, lateral ball and posterior foot intact Left - Great toe, medial, central, lateral ball and posterior foot intact  Assessment & Plan:   Problem List Items Addressed This Visit    History of hypothyroidism   Relevant Orders   TSH   HLD (hyperlipidemia)    Lipid panel today.       Diabetes mellitus, type 2 (HCC)    Unclear of control. No meds currently. Labs including A1C today.      Relevant Orders   HgB A1c   Lipid panel   Microalbumin / creatinine urine ratio   Cholelithiasis -  Primary    New problem (to me). Patient with worsening RUQ pain and known cholelithiasis. Referral to Gen surg (for surgery). Declined pain medication. Advised PRN Tylenol/Ibuprofen. Labs today.      Relevant Orders   Comp Met (CMET)   Ambulatory referral to General Surgery   Preventative health care    Pneumovax given today. Foot exam today. Labs - see orders. Microalbumin today.  Reports that Tdap is up to date but doesn't know when it was given in the last 10 years.  Encouraged to follow up with [facility] for yearly exam.       Relevant Orders   Pneumococcal polysaccharide vaccine 23-valent greater than or equal to 2yo subcutaneous/IM (Completed)    Other Visit Diagnoses    Screening for deficiency anemia        Relevant Orders    CBC       Outpatient Encounter Prescriptions as of 02/02/2015  Medication Sig  . Multiple Vitamin (MULTIVITAMIN) capsule Take 1 capsule by mouth daily.  . [DISCONTINUED] doxycycline (VIBRAMYCIN) 100 MG capsule Take 1 capsule (100 mg total) by mouth 2 (two) times daily.   No facility-administered encounter medications on file as of 02/02/2015.    Follow-up: Follow up to be scheduled after labs return  [place]

## 2015-02-02 NOTE — Assessment & Plan Note (Signed)
Lipid panel today

## 2015-02-02 NOTE — Patient Instructions (Signed)
They will call with the referral information/appt.  We will call with your lab results.  Follow up to be scheduled after your labs return.  Take care  Dr. Lacinda Axon

## 2015-02-05 ENCOUNTER — Telehealth: Payer: Self-pay | Admitting: Adult Health

## 2015-02-05 ENCOUNTER — Ambulatory Visit: Payer: Self-pay | Admitting: Physician Assistant

## 2015-02-05 ENCOUNTER — Encounter: Payer: Self-pay | Admitting: Physician Assistant

## 2015-02-05 ENCOUNTER — Other Ambulatory Visit: Payer: Self-pay | Admitting: Family Medicine

## 2015-02-05 VITALS — BP 120/90 | HR 80 | Temp 98.7°F

## 2015-02-05 DIAGNOSIS — F410 Panic disorder [episodic paroxysmal anxiety] without agoraphobia: Secondary | ICD-10-CM

## 2015-02-05 LAB — TSH: TSH: 3.09 u[IU]/mL (ref 0.35–4.50)

## 2015-02-05 MED ORDER — ATORVASTATIN CALCIUM 40 MG PO TABS
40.0000 mg | ORAL_TABLET | Freq: Every day | ORAL | Status: DC
Start: 2015-02-05 — End: 2015-02-12

## 2015-02-05 NOTE — Telephone Encounter (Signed)
Please advise 

## 2015-02-05 NOTE — Telephone Encounter (Signed)
Pt would like to know why he was prescribed Lipitor. His labs were done as a non-fasting lab. Pt had a large meal before having labs. Please call pt on cell number.

## 2015-02-05 NOTE — Progress Notes (Signed)
S: states he is really stressed out, having difficulty at home as his fiance is changing jobs and bills are piling up, states his fiance really wanted him to come, panic attacks are lasting about 33minutes, starts breathing hard, feels like someone is sitting on his chest, hasn't happened at work, denies si/hi  O: vitals wnl, nad, lungs c t a, cv rrr  A: anxiety  P: anxiety is situational as has never had this problem before, recommend he see [facility] first and if they feel he needs medication will rx paxil

## 2015-02-05 NOTE — Telephone Encounter (Signed)
Called patient to talk about repeat labs if he wanted to. Please schedule repeat labs.

## 2015-02-12 ENCOUNTER — Ambulatory Visit (INDEPENDENT_AMBULATORY_CARE_PROVIDER_SITE_OTHER): Payer: 59 | Admitting: General Surgery

## 2015-02-12 ENCOUNTER — Encounter: Payer: Self-pay | Admitting: General Surgery

## 2015-02-12 DIAGNOSIS — K8011 Calculus of gallbladder with chronic cholecystitis with obstruction: Secondary | ICD-10-CM | POA: Diagnosis not present

## 2015-02-12 NOTE — Patient Instructions (Addendum)
Laparoscopic Cholecystectomy Laparoscopic cholecystectomy is surgery to remove the gallbladder. The gallbladder is located in the upper right part of the abdomen, behind the liver. It is a storage sac for bile, which is produced in the liver. Bile aids in the digestion and absorption of fats. Cholecystectomy is often done for inflammation of the gallbladder (cholecystitis). This condition is usually caused by a buildup of gallstones (cholelithiasis) in the gallbladder. Gallstones can block the flow of bile, and that can result in inflammation and pain. In severe cases, emergency surgery may be required. If emergency surgery is not required, you will have time to prepare for the procedure. Laparoscopic surgery is an alternative to open surgery. Laparoscopic surgery has a shorter recovery time. Your common bile duct may also need to be examined during the procedure. If stones are found in the common bile duct, they may be removed. LET YOUR HEALTH CARE PROVIDER KNOW ABOUT:  Any allergies you have.  All medicines you are taking, including vitamins, herbs, eye drops, creams, and over-the-counter medicines.  Previous problems you or members of your family have had with the use of anesthetics.  Any blood disorders you have.  Previous surgeries you have had.  Any medical conditions you have. RISKS AND COMPLICATIONS Generally, this is a safe procedure. However, problems may occur, including:  Infection.  Bleeding.  Allergic reactions to medicines.  Damage to other structures or organs.  A stone remaining in the common bile duct.  A bile leak from the cyst duct that is clipped when your gallbladder is removed.  The need to convert to open surgery, which requires a larger incision in the abdomen. This may be necessary if your surgeon thinks that it is not safe to continue with a laparoscopic procedure. BEFORE THE PROCEDURE  Ask your health care provider about:  Changing or stopping your  regular medicines. This is especially important if you are taking diabetes medicines or blood thinners.  Taking medicines such as aspirin and ibuprofen. These medicines can thin your blood. Do not take these medicines before your procedure if your health care provider instructs you not to.  Follow instructions from your health care provider about eating or drinking restrictions.  Let your health care provider know if you develop a cold or an infection before surgery.  Plan to have someone take you home after the procedure.  Ask your health care provider how your surgical site will be marked or identified.  You may be given antibiotic medicine to help prevent infection. PROCEDURE  To reduce your risk of infection:  Your health care team will wash or sanitize their hands.  Your skin will be washed with soap.  An IV tube may be inserted into one of your veins.  You will be given a medicine to make you fall asleep (general anesthetic).  A breathing tube will be placed in your mouth.  The surgeon will make several small cuts (incisions) in your abdomen.  A thin, lighted tube (laparoscope) that has a tiny camera on the end will be inserted through one of the small incisions. The camera on the laparoscope will send a picture to a TV screen (monitor) in the operating room. This will give the surgeon a good view inside your abdomen.  A gas will be pumped into your abdomen. This will expand your abdomen to give the surgeon more room to perform the surgery.  Other tools that are needed for the procedure will be inserted through the other incisions. The gallbladder will   be removed through one of the incisions.  After your gallbladder has been removed, the incisions will be closed with stitches (sutures), staples, or skin glue.  Your incisions may be covered with a bandage (dressing). The procedure may vary among health care providers and hospitals. AFTER THE PROCEDURE  Your blood  pressure, heart rate, breathing rate, and blood oxygen level will be monitored often until the medicines you were given have worn off.  You will be given medicines as needed to control your pain.   This information is not intended to replace advice given to you by your health care provider. Make sure you discuss any questions you have with your health care provider.   Document Released: 12/30/2004 Document Revised: 09/20/2014 Document Reviewed: 08/11/2012 Elsevier Interactive Patient Education 2016 [employer].  Patient's surgery has been scheduled for 02-21-15 at [facility].

## 2015-02-12 NOTE — Progress Notes (Addendum)
Patient ID: Eric Hodges., male   DOB: July 15, 1980, 35 y.o.   MRN: [id number]  Chief Complaint  Patient presents with  . gallstones    HPI Eric Hodges. is a 35 y.o. male here today for a evaluation of abdominal pain. He had an ultrasound done 2 years ago that showed a gallstone in the gallbladder neck. He also had a CT done last year. He reports that he has been having moderate right sided pain that was dependent on what he was eating. This was has been off an on for the past 2 years with more episodes occurring in the last few months. This has since stopped for the last 1.5 weeks. He is here to discuss options for treatment.  HPI I have reviewed the history of present illness with the patient.  Past Medical History  Diagnosis Date  . History of chicken pox   . Thyroid nodule   . Hypothyroidism     H/O NO MEDS  . Anxiety   . Complication of anesthesia     HARD TO WAKE UP   . DM type 2 (diabetes mellitus, type 2) (HCC)     NO MEDS-A1C WAS HIGH  AND PT  LOST WEIGHT AND A1C HAS NORMALIZED PER PT (02-15-15)    Past Surgical History  Procedure Laterality Date  . Appendectomy  06-04-14    Dr Pat Patrick  . Laparoscopic appendectomy N/A 06/04/2014    Procedure: APPENDECTOMY LAPAROSCOPIC;  Surgeon: Dia Crawford III, MD;  Location: [facility] ORS;  Service: General;  Laterality: N/A;    Family History  Problem Relation Age of Onset  . Hyperlipidemia Mother   . Hypertension Mother   . Leukemia Father     Social History Social History  Substance Use Topics  . Smoking status: Never Smoker   . Smokeless tobacco: Never Used  . Alcohol Use: 1.2 oz/week    2 Standard drinks or equivalent per week     Comment: Occasionally    No Known Allergies  No current facility-administered medications for this visit.   No current outpatient prescriptions on file.   Facility-Administered Medications Ordered in Other Visits  Medication Dose Route Frequency Provider Last Rate Last Dose  . 0.9 %  sodium  chloride infusion   Intravenous Continuous Precious Haws Piscitello, MD      . ceFAZolin (ANCEF) 2-3 GM-% IVPB SOLR           . ceFAZolin (ANCEF) IVPB 2 g/50 mL premix  2 g Intravenous On Call to OR Christene Lye, MD      . Derrill Memo ON 02/22/2015] chlorhexidine (HIBICLENS) 4 % liquid 1 application  1 application Topical Once Obi Scrima Robinette Haines, MD      . famotidine (PEPCID) 20 MG tablet           . famotidine (PEPCID) tablet 20 mg  20 mg Oral Once Andria Frames, MD        Review of Systems Review of Systems  Constitutional: Negative.   Respiratory: Negative.   Cardiovascular: Negative.   Gastrointestinal: Positive for abdominal pain (occasional). Negative for nausea, vomiting, diarrhea, constipation, blood in stool, abdominal distention, anal bleeding and rectal pain.    Blood pressure 122/78, pulse 82, resp. rate 14, height 5\' 7"  (1.702 m), weight 203 lb 12.8 oz (92.443 kg).  Physical Exam Physical Exam  Constitutional: He is oriented to person, place, and time. He appears well-developed.  Eyes: Conjunctivae are normal. No scleral icterus.  Neck: Neck  supple.  Cardiovascular: Normal rate, regular rhythm and normal heart sounds.   Pulmonary/Chest: Effort normal and breath sounds normal.  Abdominal: Soft. Normal appearance and bowel sounds are normal. There is no hepatomegaly.  Lymphadenopathy:    He has no cervical adenopathy.  Neurological: He is alert and oriented to person, place, and time.  Skin: Skin is warm and dry.  Psychiatric: He has a normal mood and affect.    Data Reviewed Notes, CT, and Ultrasound reviewed. [place] 2 yrs ago showed gallstones with one in neck of gallbladder Recent labs-cbc,lfts were normal. Assessment    Chronic cholecystitis with cholelithiasis, biliary colic.    Plan    Recommended cholecystectomy, pros and cons discussed, procedure explained.     Laparoscopic Cholecystectomy with Intraoperative Cholangiogram. The procedure, including  it's potential risks and complications (including but not limited to infection, bleeding, injury to intra-abdominal organs or bile ducts, bile leak, poor cosmetic result, sepsis and death) were discussed with the patient in detail. Non-operative options, including their inherent risks (acute calculous cholecystitis with possible choledocholithiasis or gallstone pancreatitis, with the risk of ascending cholangitis, sepsis, and death) were discussed as well. The patient expressed and understanding of what we discussed and wishes to proceed with laparoscopic cholecystectomy. The patient further understands that if it is technically not possible, or it is unsafe to proceed laparoscopically, that I will convert to an open cholecystectomy.  Patient's surgery has been scheduled for 02-21-15 at [facility]  PCP:  Thersa Salt G This has been scribed by Lesly Rubenstein LPN       [facility] 02/21/2015, 11:34 AM

## 2015-02-15 ENCOUNTER — Other Ambulatory Visit: Payer: Self-pay

## 2015-02-15 ENCOUNTER — Encounter: Payer: Self-pay | Admitting: *Deleted

## 2015-02-15 NOTE — Patient Instructions (Signed)
  Your procedure is scheduled on: 02-21-15 Report to [place] To find out your arrival time please call [phone] between 1PM - 3PM on 02-20-15  Remember: Instructions that are not followed completely may result in serious medical risk, up to and including death, or upon the discretion of your surgeon and anesthesiologist your surgery may need to be rescheduled.    _X___ 1. Do not eat food or drink liquids after midnight. No gum chewing or hard candies.     _X___ 2. No Alcohol for 24 hours before or after surgery.   ____ 3. Bring all medications with you on the day of surgery if instructed.    ____ 4. Notify your doctor if there is any change in your medical condition     (cold, fever, infections).     Do not wear jewelry, make-up, hairpins, clips or nail polish.  Do not wear lotions, powders, or perfumes. You may wear deodorant.  Do not shave 48 hours prior to surgery. Men may shave face and neck.  Do not bring valuables to the hospital.    [facility] is not responsible for any belongings or valuables.               Contacts, dentures or bridgework may not be worn into surgery.  Leave your suitcase in the car. After surgery it may be brought to your room.  For patients admitted to the hospital, discharge time is determined by your treatment team.   Patients discharged the day of surgery will not be allowed to drive home.   Please read over the following fact sheets that you were given:      ____ Take these medicines the morning of surgery with A SIP OF WATER:    1. NONE  2.   3.   4.  5.  6.  ____ Fleet Enema (as directed)   ____ Use CHG Soap as directed  ____ Use inhalers on the day of surgery  ____ Stop metformin 2 days prior to surgery    ____ Take 1/2 of usual insulin dose the night before surgery and none on the morning of surgery.   ____ Stop Coumadin/Plavix/aspirin-N/A  _X___ Stop Anti-inflammatories-STOP ADVIL NOW-NO NSAIDS  OR ASA PRODUCTS-TYLENOL OK TO TAKE   ____ Stop supplements until after surgery.    ____ Bring C-Pap to the hospital.

## 2015-02-19 ENCOUNTER — Ambulatory Visit: Payer: Self-pay | Admitting: Family Medicine

## 2015-02-20 ENCOUNTER — Encounter: Payer: Self-pay | Admitting: Family Medicine

## 2015-02-20 ENCOUNTER — Ambulatory Visit (INDEPENDENT_AMBULATORY_CARE_PROVIDER_SITE_OTHER): Payer: 59 | Admitting: Family Medicine

## 2015-02-20 ENCOUNTER — Encounter: Payer: Self-pay | Admitting: *Deleted

## 2015-02-20 VITALS — BP 128/100 | HR 106 | Temp 98.2°F | Ht 67.0 in | Wt 200.5 lb

## 2015-02-20 DIAGNOSIS — E785 Hyperlipidemia, unspecified: Secondary | ICD-10-CM

## 2015-02-20 DIAGNOSIS — F411 Generalized anxiety disorder: Secondary | ICD-10-CM | POA: Diagnosis not present

## 2015-02-20 MED ORDER — ALPRAZOLAM 0.25 MG PO TABS: 0.2500 mg | ORAL_TABLET | Freq: Two times a day (BID) | ORAL | Status: DC | PRN

## 2015-02-20 MED ORDER — ESCITALOPRAM OXALATE 10 MG PO TABS: 10.0000 mg | ORAL_TABLET | Freq: Every day | ORAL | Status: DC

## 2015-02-20 NOTE — Patient Instructions (Signed)
Take the lexapro daily. Use the xanax twice daily as needed.  Follow up in 1-3 months.  Take care and good luck with the surgery  Dr. Lacinda Axon

## 2015-02-20 NOTE — Assessment & Plan Note (Addendum)
New problem. Patient with significant difficulty with anxiety. Patient visibly anxious today. We discussed his laboratory results in detail and I tried to reassure him about the development of chronic kidney disease. After lengthy discussion about treatment options regarding anxiety, patient would like to pursue combination therapy with Lexapro and PRN Xanax (Xanax will only be short term).

## 2015-02-20 NOTE — Progress Notes (Signed)
Pre visit review using our clinic review tool, if applicable. No additional management support is needed unless otherwise documented below in the visit note. 

## 2015-02-20 NOTE — Progress Notes (Signed)
Subjective:  Patient ID: Eric Sevin., male    DOB: 18-Jun-1980  Age: 35 y.o. MRN: [id number]  CC: Follow up - discuss labs, cholesterol; Anxiety  HPI:  35 year old male with a past medical history of well-controlled type 2 diabetes, cholelithiasis, hyperlipidemia presents for follow-up and has additional concerns as below.  HLD  Uncontrolled.  Will review labs and discuss treatment options today.  Anxiety  Patient states that recently he has been expressing severe anxiety regarding his health.  He is looked at his lab values that were sent electronically and he is concerned about kidney disease as his GFR was 98.  He is concerned that he's developing chronic kidney disease.  He also feels that his back is now hurting around the kidney region.  Additionally, he states that his legs are always cold and he is concerned that he may have vascular disease.  He states that he has been expanding significant anxiety regarding his health as evident from his concerns.  No known relieving factors.  He states that he's been on medication in the past.  He has recently seen someone at his work who recommended he see a Social worker. He states that he does not want to do this. He like to discuss medication options today.  Social Hx   Social History   Social History  . Marital Status: Single    Spouse Name: N/A  . Number of Children: N/A  . Years of Education: N/A   Social History Main Topics  . Smoking status: Never Smoker   . Smokeless tobacco: Never Used  . Alcohol Use: 1.2 oz/week    2 Standard drinks or equivalent per week     Comment: Occasionally  . Drug Use: No  . Sexual Activity: Not Asked   Other Topics Concern  . None   Social History Narrative   Review of Systems  Constitutional: Negative.   Psychiatric/Behavioral: The patient is nervous/anxious.    Objective:  BP 128/100 mmHg  Pulse 106  Temp(Src) 98.2 F (36.8 C) (Oral)  Ht 5\' 7"  (1.702 m)  Wt 200  lb 8 oz (90.946 kg)  BMI 31.40 kg/m2  SpO2 98%  BP/Weight 02/20/2015 02/15/2015 [id number]  Systolic BP 0000000 - -  Diastolic BP 123XX123 - -  Wt. (Lbs) 200.5 200 203.8  BMI 31.4 - 31.91   Physical Exam  Constitutional: He is oriented to person, place, and time. He appears well-developed. No distress.  HENT:  Head: Normocephalic and atraumatic.  Eyes: Conjunctivae are normal. No scleral icterus.  Pulmonary/Chest: Effort normal.  Neurological: He is alert and oriented to person, place, and time.  Psychiatric:  Patient very anxious today. Patient perseverative about laboratory values and development of complications from diabetes.  Vitals reviewed.  Lab Results  Component Value Date   WBC 6.8 02/02/2015   HGB 15.3 02/02/2015   HCT 45.6 02/02/2015   PLT 260.0 02/02/2015   GLUCOSE 82 02/02/2015   CHOL 230* 02/02/2015   TRIG 328.0* 02/02/2015   HDL 38.50* 02/02/2015   LDLDIRECT 145.0 02/02/2015   LDLCALC 149* 05/05/2013   ALT 33 02/02/2015   AST 24 02/02/2015   NA 140 02/02/2015   K 4.3 02/02/2015   CL 104 02/02/2015   CREATININE 0.93 02/02/2015   BUN 18 02/02/2015   CO2 29 02/02/2015   TSH 3.09 02/02/2015   HGBA1C 6.0 02/02/2015   MICROALBUR <0.7 02/02/2015    Assessment & Plan:   Problem List Items Addressed This Visit  HLD (hyperlipidemia) - Primary    Uncontrolled. I have previously recommended statin therapy. Patient is hesitant regarding this. He would like to pursue diet and exercise and reevaluate in 1-3 months.      Generalized anxiety disorder    New problem. Patient with significant difficulty with anxiety. Patient visibly anxious today. We discussed his laboratory results in detail and I tried to reassure him about the development of chronic kidney disease. After lengthy discussion about treatment options regarding anxiety, patient would like to pursue combination therapy with Lexapro and PRN Xanax (Xanax will only be short term).          Meds ordered  this encounter  Medications  . ALPRAZolam (XANAX) 0.25 MG tablet    Sig: Take 1 tablet (0.25 mg total) by mouth 2 (two) times daily as needed for anxiety.    Dispense:  30 tablet    Refill:  0  . escitalopram (LEXAPRO) 10 MG tablet    Sig: Take 1 tablet (10 mg total) by mouth daily.    Dispense:  90 tablet    Refill:  0   Follow-up: 1-3 months.   [place]

## 2015-02-20 NOTE — Assessment & Plan Note (Signed)
Uncontrolled. I have previously recommended statin therapy. Patient is hesitant regarding this. He would like to pursue diet and exercise and reevaluate in 1-3 months.

## 2015-02-21 ENCOUNTER — Encounter: Payer: Self-pay | Admitting: *Deleted

## 2015-02-21 ENCOUNTER — Ambulatory Visit: Payer: 59 | Admitting: Anesthesiology

## 2015-02-21 ENCOUNTER — Ambulatory Visit
Admission: RE | Admit: 2015-02-21 | Discharge: 2015-02-21 | Disposition: A | Discharge status: Home / Self Care | Payer: 59 | Source: Ambulatory Visit | Attending: General Surgery | Admitting: General Surgery

## 2015-02-21 ENCOUNTER — Encounter
Admission: RE | Disposition: A | Discharge status: Home / Self Care | Payer: Self-pay | Source: Ambulatory Visit | Attending: General Surgery

## 2015-02-21 ENCOUNTER — Ambulatory Visit: Payer: 59

## 2015-02-21 DIAGNOSIS — E039 Hypothyroidism, unspecified: Secondary | ICD-10-CM | POA: Insufficient documentation

## 2015-02-21 DIAGNOSIS — E119 Type 2 diabetes mellitus without complications: Secondary | ICD-10-CM | POA: Diagnosis not present

## 2015-02-21 DIAGNOSIS — K8011 Calculus of gallbladder with chronic cholecystitis with obstruction: Secondary | ICD-10-CM

## 2015-02-21 DIAGNOSIS — Z806 Family history of leukemia: Secondary | ICD-10-CM | POA: Insufficient documentation

## 2015-02-21 DIAGNOSIS — K824 Cholesterolosis of gallbladder: Secondary | ICD-10-CM | POA: Diagnosis not present

## 2015-02-21 DIAGNOSIS — K811 Chronic cholecystitis: Secondary | ICD-10-CM | POA: Diagnosis not present

## 2015-02-21 DIAGNOSIS — Z79899 Other long term (current) drug therapy: Secondary | ICD-10-CM | POA: Diagnosis not present

## 2015-02-21 DIAGNOSIS — K8010 Calculus of gallbladder with chronic cholecystitis without obstruction: Secondary | ICD-10-CM | POA: Insufficient documentation

## 2015-02-21 DIAGNOSIS — F419 Anxiety disorder, unspecified: Secondary | ICD-10-CM | POA: Insufficient documentation

## 2015-02-21 DIAGNOSIS — Z8249 Family history of ischemic heart disease and other diseases of the circulatory system: Secondary | ICD-10-CM | POA: Diagnosis not present

## 2015-02-21 DIAGNOSIS — Z8349 Family history of other endocrine, nutritional and metabolic diseases: Secondary | ICD-10-CM | POA: Insufficient documentation

## 2015-02-21 HISTORY — PX: CHOLECYSTECTOMY: SHX55

## 2015-02-21 HISTORY — DX: Other complications of anesthesia, initial encounter: T88.59XA

## 2015-02-21 HISTORY — DX: Anxiety disorder, unspecified: F41.9

## 2015-02-21 HISTORY — DX: Adverse effect of unspecified anesthetic, initial encounter: T41.45XA

## 2015-02-21 LAB — GLUCOSE, CAPILLARY: Glucose-Capillary: 121 mg/dL — ABNORMAL HIGH (ref 65–99)

## 2015-02-21 SURGERY — LAPAROSCOPIC CHOLECYSTECTOMY WITH INTRAOPERATIVE CHOLANGIOGRAM
Anesthesia: General | Wound class: Clean Contaminated

## 2015-02-21 MED ORDER — FENTANYL CITRATE (PF) 100 MCG/2ML IJ SOLN
INTRAMUSCULAR | Status: DC | PRN
Start: 2015-02-21 — End: 2015-02-21
  Administered 2015-02-21 (×4): 50 ug via INTRAVENOUS

## 2015-02-21 MED ORDER — SODIUM CHLORIDE 0.9 % IV SOLN
INTRAVENOUS | Status: DC | PRN
  Administered 2015-02-21: 8 mL

## 2015-02-21 MED ORDER — OXYCODONE-ACETAMINOPHEN 5-325 MG PO TABS: 1.0000 | ORAL_TABLET | ORAL | Status: DC | PRN

## 2015-02-21 MED ORDER — SODIUM CHLORIDE 0.9 % IV SOLN
INTRAVENOUS | Status: DC
  Administered 2015-02-21 (×2): via INTRAVENOUS

## 2015-02-21 MED ORDER — LACTATED RINGERS IR SOLN
Status: DC | PRN
Start: 2015-02-21 — End: 2015-02-21
  Administered 2015-02-21: 200 mL

## 2015-02-21 MED ORDER — LIDOCAINE HCL (CARDIAC) 20 MG/ML IV SOLN
INTRAVENOUS | Status: DC | PRN
  Administered 2015-02-21: 30 mg via INTRAVENOUS

## 2015-02-21 MED ORDER — FAMOTIDINE 20 MG PO TABS
ORAL_TABLET | ORAL | Status: AC
  Administered 2015-02-21: 20 mg via ORAL
  Filled 2015-02-21: qty 1

## 2015-02-21 MED ORDER — ACETAMINOPHEN 10 MG/ML IV SOLN
INTRAVENOUS | Status: AC
  Filled 2015-02-21: qty 100

## 2015-02-21 MED ORDER — SODIUM CHLORIDE 0.9 % IJ SOLN
INTRAMUSCULAR | Status: AC
  Filled 2015-02-21: qty 50

## 2015-02-21 MED ORDER — PROPOFOL 10 MG/ML IV BOLUS
INTRAVENOUS | Status: DC | PRN
  Administered 2015-02-21: 5 mg via INTRAVENOUS
  Administered 2015-02-21: 150 mg via INTRAVENOUS

## 2015-02-21 MED ORDER — ACETAMINOPHEN 10 MG/ML IV SOLN
INTRAVENOUS | Status: DC | PRN
  Administered 2015-02-21: 1000 mg via INTRAVENOUS

## 2015-02-21 MED ORDER — HYDROMORPHONE HCL 1 MG/ML IJ SOLN
0.2500 mg | INTRAMUSCULAR | Status: DC | PRN
Start: 2015-02-21 — End: 2015-02-21
  Administered 2015-02-21: 0.25 mg via INTRAVENOUS
  Administered 2015-02-21: 0.5 mg via INTRAVENOUS
  Administered 2015-02-21: 0.25 mg via INTRAVENOUS

## 2015-02-21 MED ORDER — NEOSTIGMINE METHYLSULFATE 10 MG/10ML IV SOLN
INTRAVENOUS | Status: DC | PRN
  Administered 2015-02-21: 3 mg via INTRAVENOUS

## 2015-02-21 MED ORDER — ROCURONIUM BROMIDE 100 MG/10ML IV SOLN
INTRAVENOUS | Status: DC | PRN
  Administered 2015-02-21: 40 mg via INTRAVENOUS
  Administered 2015-02-21: 20 mg via INTRAVENOUS

## 2015-02-21 MED ORDER — ONDANSETRON HCL 4 MG/2ML IJ SOLN: 4.0000 mg | Freq: Once | INTRAMUSCULAR | Status: DC | PRN

## 2015-02-21 MED ORDER — FAMOTIDINE 20 MG PO TABS
20.0000 mg | ORAL_TABLET | Freq: Once | ORAL | Status: AC
Start: 2015-02-21 — End: 2015-02-21
  Administered 2015-02-21: 20 mg via ORAL

## 2015-02-21 MED ORDER — KETOROLAC TROMETHAMINE 30 MG/ML IJ SOLN
INTRAMUSCULAR | Status: AC
  Administered 2015-02-21: 30 mg via INTRAVENOUS
  Filled 2015-02-21: qty 1

## 2015-02-21 MED ORDER — KETOROLAC TROMETHAMINE 30 MG/ML IJ SOLN
30.0000 mg | Freq: Once | INTRAMUSCULAR | Status: AC
  Administered 2015-02-21: 30 mg via INTRAVENOUS

## 2015-02-21 MED ORDER — CEFAZOLIN SODIUM-DEXTROSE 2-3 GM-% IV SOLR
2.0000 g | INTRAVENOUS | Status: AC
  Administered 2015-02-21: 2 g via INTRAVENOUS

## 2015-02-21 MED ORDER — HYDROMORPHONE HCL 1 MG/ML IJ SOLN
INTRAMUSCULAR | Status: AC
  Administered 2015-02-21: 0.25 mg via INTRAVENOUS
  Filled 2015-02-21: qty 1

## 2015-02-21 MED ORDER — MIDAZOLAM HCL 2 MG/2ML IJ SOLN
INTRAMUSCULAR | Status: DC | PRN
  Administered 2015-02-21 (×2): 1 mg via INTRAVENOUS

## 2015-02-21 MED ORDER — GLYCOPYRROLATE 0.2 MG/ML IJ SOLN
INTRAMUSCULAR | Status: DC | PRN
  Administered 2015-02-21: 0.6 mg via INTRAVENOUS

## 2015-02-21 MED ORDER — CEFAZOLIN SODIUM-DEXTROSE 2-3 GM-% IV SOLR
INTRAVENOUS | Status: AC
  Administered 2015-02-21: 2 g via INTRAVENOUS
  Filled 2015-02-21: qty 50

## 2015-02-21 MED ORDER — CHLORHEXIDINE GLUCONATE 4 % EX LIQD: 1.0000 "application " | Freq: Once | CUTANEOUS | Status: DC

## 2015-02-21 SURGICAL SUPPLY — 37 items
ANCHOR TIS RET SYS 235ML (MISCELLANEOUS) ×2 IMPLANT
APPLICATOR SURGIFLO (MISCELLANEOUS) IMPLANT
APPLIER CLIP LOGIC TI 5 (MISCELLANEOUS) ×2 IMPLANT
BLADE SURG 11 STRL SS SAFETY (MISCELLANEOUS) ×2 IMPLANT
CANISTER SUCT 1200ML W/VALVE (MISCELLANEOUS) ×2 IMPLANT
CANNULA DILATOR 10 W/SLV (CANNULA) ×2 IMPLANT
CATH CHOLANG 76X19 KUMAR (CATHETERS) ×2 IMPLANT
CHLORAPREP W/TINT 26ML (MISCELLANEOUS) ×2 IMPLANT
DEFOGGER SCOPE WARMER CLEARIFY (MISCELLANEOUS) ×2 IMPLANT
DRAPE C-ARM XRAY 36X54 (DRAPES) ×2 IMPLANT
DRAPE INCISE IOBAN 66X45 STRL (DRAPES) ×2 IMPLANT
DRESSING TELFA 4X3 1S ST N-ADH (GAUZE/BANDAGES/DRESSINGS) ×2 IMPLANT
DRSG TEGADERM 2-3/8X2-3/4 SM (GAUZE/BANDAGES/DRESSINGS) ×8 IMPLANT
ELECT REM PT RETURN 9FT ADLT (ELECTROSURGICAL) ×2
ELECTRODE REM PT RTRN 9FT ADLT (ELECTROSURGICAL) ×1 IMPLANT
GLOVE BIO SURGEON STRL SZ7 (GLOVE) ×10 IMPLANT
GOWN STRL REUS W/ TWL LRG LVL3 (GOWN DISPOSABLE) ×3 IMPLANT
GOWN STRL REUS W/TWL LRG LVL3 (GOWN DISPOSABLE) ×3
GRASPER SUT TROCAR 14GX15 (MISCELLANEOUS) ×2 IMPLANT
HEMOSTAT SURGICEL 2X3 (HEMOSTASIS) IMPLANT
IRRIGATION STRYKERFLOW (MISCELLANEOUS) ×1 IMPLANT
IRRIGATOR STRYKERFLOW (MISCELLANEOUS) ×2
IV LACTATED RINGERS 1000ML (IV SOLUTION) ×2 IMPLANT
KIT RM TURNOVER STRD PROC AR (KITS) ×2 IMPLANT
LABEL OR SOLS (LABEL) ×2 IMPLANT
NDL INSUFF ACCESS 14 VERSASTEP (NEEDLE) ×2 IMPLANT
PACK LAP CHOLECYSTECTOMY (MISCELLANEOUS) ×2 IMPLANT
SCISSORS METZENBAUM CVD 33 (INSTRUMENTS) ×2 IMPLANT
SLEEVE ENDOPATH XCEL 5M (ENDOMECHANICALS) ×4 IMPLANT
SPOGE SURGIFLO 8M (HEMOSTASIS)
SPONGE SURGIFLO 8M (HEMOSTASIS) IMPLANT
STRIP CLOSURE SKIN 1/2X4 (GAUZE/BANDAGES/DRESSINGS) ×2 IMPLANT
SUT VIC AB 0 SH 27 (SUTURE) ×2 IMPLANT
SUT VIC AB 4-0 FS2 27 (SUTURE) ×2 IMPLANT
SWABSTK COMLB BENZOIN TINCTURE (MISCELLANEOUS) ×2 IMPLANT
TROCAR XCEL NON-BLD 5MMX100MML (ENDOMECHANICALS) ×2 IMPLANT
TUBING INSUFFLATOR HI FLOW (MISCELLANEOUS) ×2 IMPLANT

## 2015-02-21 NOTE — Transfer of Care (Signed)
Immediate Anesthesia Transfer of Care Note  Patient: Eric Hodges.  Procedure(s) Performed: Procedure(s): LAPAROSCOPIC CHOLECYSTECTOMY WITH INTRAOPERATIVE CHOLANGIOGRAM (N/A)  Patient Location: PACU  Anesthesia Type:General  Level of Consciousness: awake and oriented  Airway & Oxygen Therapy: Patient Spontanous Breathing and Patient connected to face mask oxygen  Post-op Assessment: Report given to RN and Post -op Vital signs reviewed and stable  Post vital signs: Reviewed and stable  Last Vitals:  Filed Vitals:   02/21/15 1341 02/21/15 1343  BP:  137/97  Pulse: 78 72  Temp: 36.6 C 36.6 C  Resp: 13 16    Complications: No apparent anesthesia complications

## 2015-02-21 NOTE — Op Note (Signed)
Preop diagnosis: Chronic cholecystitis and cholelithiasis  Post op diagnosis: Same  Operation: Laparoscopy cholecystectomy and intraoperative cholangiogram  Surgeon: S.G.Saud Bail  Assistant:     Anesthesia: Gen.  Complications: None  EBL: Less than 25 mL  Drains: None  Description: Patient was put to sleep the supine position the operating table E hair was clipped from the abdominal wall. Abdomen was prepped and draped sterile field and timeout performed. Initial port incision was made at the inferior lip of the umbilicus and a Veress needle introduced into the peritoneal cavity verified of the hanging drop method. Pneumoperitoneum was obtained followed by insertion of a 10 mm port. Camera was introduced with good visualization of the abdominal cavity and epigastric and 2 lateral 5 mm ports were then placed. The gallbladder was mildly distended and thickened in its wall but showed no acute changes nor were any adhesions. With careful cephalad traction of the fundus of the Hartman's pouch was pushed and pulled up and the cystic duct was first the isolated and freed. Kumar clamp and catheter were then positioned. Cholangiogram was completed showing good filling of the bile duct both proximal and distal with no retained stones or obstruction to flow. The catheter was used to decompress the gallbladder and removed. Cystic duct was hemoclipped and cut. Further dissection was continued and a posterior branch of the cystic artery and the main cystic artery was separately hemoclipped and cut. Gallbladder was dissected free from its bed using cautery for control of bleeding. Area was irrigated with a small amount of fluid in and this was suctioned out. With   5 mm telescope in the epigastric port site the gallbladder was placed in a retrieval bag and brought out through the umbilical port site and noted contain a single large stone approximately 2-1/2-3 cm oval-shaped. Fascial opening of the umbilicus was  then closed with the use of a suture passer and 0 Vicryl in a figure-of-eight   suture was placed and tied. The remaining ports were then removed after ensuring Proper hemostasis within the gallbladder bed area. The skin incision was then closed with subcuticular 4-0 Vicryl reinforced with Steri-Strips and tincture benzoin. Telfa and Tegaderm dressings were placed, patient subsequently was extubated and returned recovery room stable condition.

## 2015-02-21 NOTE — Anesthesia Preprocedure Evaluation (Signed)
Anesthesia Evaluation  Patient identified by MRN, date of birth, ID band Patient awake    Reviewed: Allergy & Precautions, NPO status , Patient's Chart, lab work & pertinent test results  History of Anesthesia Complications (+) PROLONGED EMERGENCE  Airway Mallampati: II  TM Distance: >3 FB Neck ROM: Full    Dental  (+) Teeth Intact   Pulmonary neg pulmonary ROS,    Pulmonary exam normal        Cardiovascular Exercise Tolerance: Good negative cardio ROS Normal cardiovascular exam     Neuro/Psych Anxiety    GI/Hepatic Cholelithiasis.   Endo/Other  neg diabetesDiabetes has resolved since his 40 lb. Weight loss.  Renal/GU negative Renal ROS     Musculoskeletal   Abdominal (+)  Abdomen: soft.    Peds  Hematology negative hematology ROS (+)   Anesthesia Other Findings   Reproductive/Obstetrics                             Anesthesia Physical Anesthesia Plan  ASA: II  Anesthesia Plan: General   Post-op Pain Management:    Induction: Intravenous  Airway Management Planned: Oral ETT  Additional Equipment:   Intra-op Plan:   Post-operative Plan: Extubation in OR  Informed Consent: I have reviewed the patients History and Physical, chart, labs and discussed the procedure including the risks, benefits and alternatives for the proposed anesthesia with the patient or authorized representative who has indicated his/her understanding and acceptance.     Plan Discussed with: CRNA  Anesthesia Plan Comments:         Anesthesia Quick Evaluation

## 2015-02-21 NOTE — Discharge Instructions (Signed)

## 2015-02-21 NOTE — Anesthesia Postprocedure Evaluation (Signed)
Anesthesia Post Note  Patient: Eric Hodges.  Procedure(s) Performed: Procedure(s) (LRB): LAPAROSCOPIC CHOLECYSTECTOMY WITH INTRAOPERATIVE CHOLANGIOGRAM (N/A)  Patient location during evaluation: PACU Anesthesia Type: General Level of consciousness: awake Pain management: pain level controlled Vital Signs Assessment: post-procedure vital signs reviewed and stable Respiratory status: spontaneous breathing Cardiovascular status: blood pressure returned to baseline Postop Assessment: no headache Anesthetic complications: no    Last Vitals:  Filed Vitals:   02/21/15 1349 02/21/15 1355  BP:  140/91  Pulse: 74 70  Temp:    Resp: 10 16    Last Pain:  Filed Vitals:   02/21/15 1357  PainSc: 6                  Eric Hodges

## 2015-02-21 NOTE — H&P (View-Only) (Signed)
Patient ID: Eric Hodges., male   DOB: [birth year], 35 y.o.   MRN: [id number]  Chief Complaint  Patient presents with  . gallstones    HPI Eric Hodges. is a 35 y.o. male here today for a evaluation of abdominal pain. He had an ultrasound done 2 years ago that showed a gallstone in the gallbladder neck. He also had a CT done last year. He reports that he has been having moderate right sided pain that was dependent on what he was eating. This was has been off an on for the past 2 years with more episodes occurring in the last few months. This has since stopped for the last 1.5 weeks. He is here to discuss options for treatment.  HPI I have reviewed the history of present illness with the patient.  Past Medical History  Diagnosis Date  . History of chicken pox   . Thyroid nodule   . Hypothyroidism     H/O NO MEDS  . Anxiety   . Complication of anesthesia     HARD TO WAKE UP   . DM type 2 (diabetes mellitus, type 2) (HCC)     NO MEDS-A1C WAS HIGH  AND PT  LOST WEIGHT AND A1C HAS NORMALIZED PER PT (02-15-15)    Past Surgical History  Procedure Laterality Date  . Appendectomy  06-04-14    Dr Pat Patrick  . Laparoscopic appendectomy N/A 06/04/2014    Procedure: APPENDECTOMY LAPAROSCOPIC;  Surgeon: Dia Crawford III, MD;  Location: [facility] ORS;  Service: General;  Laterality: N/A;    Family History  Problem Relation Age of Onset  . Hyperlipidemia Mother   . Hypertension Mother   . Leukemia Father     Social History Social History  Substance Use Topics  . Smoking status: Never Smoker   . Smokeless tobacco: Never Used  . Alcohol Use: 1.2 oz/week    2 Standard drinks or equivalent per week     Comment: Occasionally    No Known Allergies  No current facility-administered medications for this visit.   No current outpatient prescriptions on file.   Facility-Administered Medications Ordered in Other Visits  Medication Dose Route Frequency Provider Last Rate Last Dose  . 0.9 %  sodium  chloride infusion   Intravenous Continuous Precious Haws Piscitello, MD      . ceFAZolin (ANCEF) 2-3 GM-% IVPB SOLR           . ceFAZolin (ANCEF) IVPB 2 g/50 mL premix  2 g Intravenous On Call to OR Christene Lye, MD      . Derrill Memo ON 02/22/2015] chlorhexidine (HIBICLENS) 4 % liquid 1 application  1 application Topical Once Zacherie Honeyman Robinette Haines, MD      . famotidine (PEPCID) 20 MG tablet           . famotidine (PEPCID) tablet 20 mg  20 mg Oral Once Andria Frames, MD        Review of Systems Review of Systems  Constitutional: Negative.   Respiratory: Negative.   Cardiovascular: Negative.   Gastrointestinal: Positive for abdominal pain (occasional). Negative for nausea, vomiting, diarrhea, constipation, blood in stool, abdominal distention, anal bleeding and rectal pain.    Blood pressure 122/78, pulse 82, resp. rate 14, height 5\' 7"  (1.702 m), weight 203 lb 12.8 oz (92.443 kg).  Physical Exam Physical Exam  Constitutional: He is oriented to person, place, and time. He appears well-developed.  Eyes: Conjunctivae are normal. No scleral icterus.  Neck: Neck  supple.  Cardiovascular: Normal rate, regular rhythm and normal heart sounds.   Pulmonary/Chest: Effort normal and breath sounds normal.  Abdominal: Soft. Normal appearance and bowel sounds are normal. There is no hepatomegaly.  Lymphadenopathy:    He has no cervical adenopathy.  Neurological: He is alert and oriented to person, place, and time.  Skin: Skin is warm and dry.  Psychiatric: He has a normal mood and affect.    Data Reviewed Notes, CT, and Ultrasound reviewed. [place] 2 yrs ago showed gallstones with one in neck of gallbladder Recent labs-cbc,lfts were normal. Assessment    Chronic cholecystitis with cholelithiasis, biliary colic.    Plan    Recommended cholecystectomy, pros and cons discussed, procedure explained.     Laparoscopic Cholecystectomy with Intraoperative Cholangiogram. The procedure, including  it's potential risks and complications (including but not limited to infection, bleeding, injury to intra-abdominal organs or bile ducts, bile leak, poor cosmetic result, sepsis and death) were discussed with the patient in detail. Non-operative options, including their inherent risks (acute calculous cholecystitis with possible choledocholithiasis or gallstone pancreatitis, with the risk of ascending cholangitis, sepsis, and death) were discussed as well. The patient expressed and understanding of what we discussed and wishes to proceed with laparoscopic cholecystectomy. The patient further understands that if it is technically not possible, or it is unsafe to proceed laparoscopically, that I will convert to an open cholecystectomy.  Patient's surgery has been scheduled for 02-21-15 at [facility].  PCP:  Thersa Salt G This has been scribed by Lesly Rubenstein LPN       [facility] G 02/21/2015, 11:34 AM

## 2015-02-21 NOTE — Interval H&P Note (Signed)
History and Physical Interval Note:  02/21/2015 11:34 AM  Eric Hodges.  has presented today for surgery, with the diagnosis of chronic choleystitis  The various methods of treatment have been discussed with the patient and family. After consideration of risks, benefits and other options for treatment, the patient has consented to  Procedure(s): LAPAROSCOPIC CHOLECYSTECTOMY WITH INTRAOPERATIVE CHOLANGIOGRAM (N/A) as a surgical intervention .  The patient's history has been reviewed, patient examined, no change in status, stable for surgery.  I have reviewed the patient's chart and labs.  Questions were answered to the patient's satisfaction.     [name],[name]

## 2015-02-21 NOTE — Anesthesia Procedure Notes (Signed)
Procedure Name: Intubation Date/Time: 02/21/2015 12:15 PM Performed by: Courtney Paris Pre-anesthesia Checklist: Emergency Drugs available, Patient being monitored, Suction available, Patient identified and Timeout performed Patient Re-evaluated:Patient Re-evaluated prior to inductionOxygen Delivery Method: Circle system utilized Preoxygenation: Pre-oxygenation with 100% oxygen Intubation Type: IV induction and Combination inhalational/ intravenous induction Ventilation: Mask ventilation without difficulty Laryngoscope Size: Miller and 3 Grade View: Grade II Tube type: Oral Tube size: 7.5 mm Number of attempts: 1 Airway Equipment and Method: Stylet Placement Confirmation: ETT inserted through vocal cords under direct vision,  positive ETCO2,  CO2 detector and breath sounds checked- equal and bilateral Secured at: 21 cm Tube secured with: Tape Dental Injury: Teeth and Oropharynx as per pre-operative assessment

## 2015-02-22 ENCOUNTER — Encounter: Payer: Self-pay | Admitting: General Surgery

## 2015-02-23 LAB — SURGICAL PATHOLOGY

## 2015-02-24 ENCOUNTER — Other Ambulatory Visit
Admission: RE | Admit: 2015-02-24 | Discharge: 2015-02-24 | Disposition: A | Discharge status: Home / Self Care | Payer: 59 | Source: Ambulatory Visit | Attending: General Surgery | Admitting: General Surgery

## 2015-02-24 ENCOUNTER — Other Ambulatory Visit: Payer: Self-pay | Admitting: General Surgery

## 2015-02-24 DIAGNOSIS — Z9049 Acquired absence of other specified parts of digestive tract: Secondary | ICD-10-CM | POA: Insufficient documentation

## 2015-02-24 LAB — HEPATIC FUNCTION PANEL
ALBUMIN: 4.4 g/dL (ref 3.5–5.0)
ALT: 292 U/L — ABNORMAL HIGH (ref 17–63)
AST: 85 U/L — ABNORMAL HIGH (ref 15–41)
Alkaline Phosphatase: 98 U/L (ref 38–126)
Bilirubin, Direct: <0.1 mg/dL — ABNORMAL LOW (ref 0.1–0.5)
TOTAL PROTEIN: 7.9 g/dL (ref 6.5–8.1)
Total Bilirubin: 0.7 mg/dL (ref 0.3–1.2)

## 2015-02-24 LAB — BASIC METABOLIC PANEL
ANION GAP: 5 (ref 5–15)
BUN: 17 mg/dL (ref 6–20)
CO2: 29 mmol/L (ref 22–32)
CREATININE: 0.97 mg/dL (ref 0.61–1.24)
Calcium: 9.9 mg/dL (ref 8.9–10.3)
Chloride: 104 mmol/L (ref 101–111)
GFR calc Af Amer: >60 mL/min (ref >60)
GLUCOSE: 114 mg/dL — AB (ref 65–99)
Potassium: 4 mmol/L (ref 3.5–5.1)
SODIUM: 138 mmol/L (ref 135–145)

## 2015-02-24 LAB — URINALYSIS COMPLETE WITH MICROSCOPIC (ARMC ONLY)
BILIRUBIN URINE: NEGATIVE
Bacteria, UA: NONE SEEN
GLUCOSE, UA: NEGATIVE mg/dL
HGB URINE DIPSTICK: NEGATIVE
KETONES UR: NEGATIVE mg/dL
Leukocytes, UA: NEGATIVE
Nitrite: NEGATIVE
PH: 6 (ref 5.0–8.0)
PROTEIN: NEGATIVE mg/dL
Specific Gravity, Urine: 1.015 (ref 1.005–1.030)
Squamous Epithelial / LPF: NONE SEEN

## 2015-02-24 LAB — CBC
HCT: 45.6 % (ref 40.0–52.0)
Hemoglobin: 15.2 g/dL (ref 13.0–18.0)
MCH: 28.6 pg (ref 26.0–34.0)
MCHC: 33.4 g/dL (ref 32.0–36.0)
MCV: 85.5 fL (ref 80.0–100.0)
PLATELETS: 257 10*3/uL (ref 150–440)
RBC: 5.33 MIL/uL (ref 4.40–5.90)
RDW: 13 % (ref 11.5–14.5)
WBC: 9.6 10*3/uL (ref 3.8–10.6)

## 2015-02-26 ENCOUNTER — Ambulatory Visit (INDEPENDENT_AMBULATORY_CARE_PROVIDER_SITE_OTHER): Payer: 59 | Admitting: General Surgery

## 2015-02-26 ENCOUNTER — Encounter: Payer: Self-pay | Admitting: General Surgery

## 2015-02-26 VITALS — BP 128/76 | HR 78 | Resp 12 | Ht 67.0 in | Wt 199.0 lb

## 2015-02-26 DIAGNOSIS — K8011 Calculus of gallbladder with chronic cholecystitis with obstruction: Secondary | ICD-10-CM

## 2015-02-26 NOTE — Progress Notes (Signed)
Patient ID: Eric Sevin., male   DOB: November 06, 1980, 35 y.o.   MRN: [id number]  Patient here today for a post op follow up of laparoscopy cholecystectomy and intraoperative cholangiogram done on 02/21/15. Patient not taking pain medication no longer. Doing well, no dietary or bowel issues. 2 days ago he noted his urine was like ice tea. Labs -lfts, U/a were normal.   Port sites are clean and healing well. No signs of infection. No scleral icterus. Abdomen is soft, non tender.    Follow up as needed.    This information has been scribed by Verlene Mayer, CMA    PCP: Dr. Amado Nash

## 2015-02-26 NOTE — Patient Instructions (Signed)
- 

## 2015-02-28 ENCOUNTER — Ambulatory Visit: Payer: 59 | Admitting: General Surgery

## 2015-05-04 ENCOUNTER — Ambulatory Visit: Payer: Self-pay | Admitting: Physician Assistant

## 2015-09-04 DIAGNOSIS — E119 Type 2 diabetes mellitus without complications: Secondary | ICD-10-CM | POA: Diagnosis not present

## 2015-09-25 ENCOUNTER — Encounter: Payer: Self-pay | Admitting: Physician Assistant

## 2015-09-25 ENCOUNTER — Ambulatory Visit: Payer: Self-pay | Admitting: Physician Assistant

## 2015-09-25 VITALS — BP 112/86 | HR 80 | Temp 97.9°F

## 2015-09-25 DIAGNOSIS — J069 Acute upper respiratory infection, unspecified: Secondary | ICD-10-CM

## 2015-09-25 MED ORDER — AZITHROMYCIN 250 MG PO TABS: ORAL_TABLET | ORAL | 0 refills | Status: DC

## 2015-09-25 NOTE — Progress Notes (Signed)
S: C/o runny nose and congestion for 3 weeks, sx are worse upon waking, clear up throughout the day, no fever, chills, cp/sob, v/d;  concerned bc is going to [place] this weekend  O: PE: vitals wnl, nad, perrl eomi, normocephalic, tms dull, nasal mucosa red and swollen, throat injected, neck supple no lymph, lungs c t a, cv rrr, neuro intact  A:  Acute  uri   P: drink fluids, continue regular meds , use otc meds of choice, return if not improving in 5 days, return earlier if worsening , otc allergy med, rx for zpack to take with him, is to use if worsening

## 2015-12-04 ENCOUNTER — Encounter: Payer: Self-pay | Admitting: Family

## 2015-12-04 ENCOUNTER — Ambulatory Visit (INDEPENDENT_AMBULATORY_CARE_PROVIDER_SITE_OTHER): Payer: 59 | Admitting: Family

## 2015-12-04 ENCOUNTER — Telehealth: Payer: Self-pay | Admitting: Family Medicine

## 2015-12-04 VITALS — BP 124/86 | HR 84 | Ht 67.0 in | Wt 198.6 lb

## 2015-12-04 DIAGNOSIS — R030 Elevated blood-pressure reading, without diagnosis of hypertension: Secondary | ICD-10-CM | POA: Diagnosis not present

## 2015-12-04 DIAGNOSIS — E119 Type 2 diabetes mellitus without complications: Secondary | ICD-10-CM

## 2015-12-04 DIAGNOSIS — R0789 Other chest pain: Secondary | ICD-10-CM | POA: Insufficient documentation

## 2015-12-04 DIAGNOSIS — F411 Generalized anxiety disorder: Secondary | ICD-10-CM

## 2015-12-04 LAB — HEMOGLOBIN A1C: Hgb A1c MFr Bld: 6 % (ref 4.6–6.5)

## 2015-12-04 MED ORDER — ALPRAZOLAM 0.25 MG PO TABS: 0.2500 mg | ORAL_TABLET | Freq: Every day | ORAL | 0 refills | Status: DC | PRN

## 2015-12-04 MED ORDER — ESCITALOPRAM OXALATE 10 MG PO TABS: 10.0000 mg | ORAL_TABLET | Freq: Every day | ORAL | 0 refills | Status: DC

## 2015-12-04 NOTE — Progress Notes (Signed)
Subjective:    Patient ID: Eric Hodges., male    DOB: 16-Sep-1980, 35 y.o.   MRN: [id number]  CC: Eric Hodges. is a 35 y.o. male who presents today for an acute visit.    HPI: Patient here for acute visit with chief complaint of anxiety and high blood pressure past couple of weeks.  States apple watch which would read 95. BP at work 136/91.   GAD- Comes and  Goes. Tried lexapro twice. Using half tablet of xanax. Will go a couple of months and not need xanax and then may need once a week. Trouble with marriage. Tearful lately. No problems sleeping. No thoughts of hurting himself or anyone else.    Over past several months has self limiting occasional episodes of anxiety where he also has chest tightness, left arm pain and palpitations. He also notes SOB with exercise. Notes not exercising at this time. Strong family h/o CVD.Denies exertional chest pain or pressure, numbness or tingling radiating to  jaw,  dizziness, frequent headaches, changes in vision, or shortness of breath.    Weight gain- had a1c of 12 years past; concerned for DM and would like rechecked.       HISTORY:  Past Medical History:  Diagnosis Date  . Anxiety   . Complication of anesthesia    HARD TO WAKE UP   . DM type 2 (diabetes mellitus, type 2) (HCC)    NO MEDS-A1C WAS HIGH  AND PT  LOST WEIGHT AND A1C HAS NORMALIZED PER PT (02-15-15)  . History of chicken pox   . Hypothyroidism    H/O NO MEDS  . Thyroid nodule    Past Surgical History:  Procedure Laterality Date  . APPENDECTOMY  06-04-14   Dr Pat Patrick  . CHOLECYSTECTOMY N/A 02/21/2015   Procedure: LAPAROSCOPIC CHOLECYSTECTOMY WITH INTRAOPERATIVE CHOLANGIOGRAM;  Surgeon: Christene Lye, MD;  Location: [facility] ORS;  Service: General;  Laterality: N/A;  . LAPAROSCOPIC APPENDECTOMY N/A 06/04/2014   Procedure: APPENDECTOMY LAPAROSCOPIC;  Surgeon: Dia Crawford III, MD;  Location: [facility] ORS;  Service: General;  Laterality: N/A;   Family History  Problem  Relation Age of Onset  . Hyperlipidemia Mother   . Hypertension Mother   . Leukemia Father     Allergies: Patient has no known allergies. Current Outpatient Prescriptions on File Prior to Visit  Medication Sig Dispense Refill  . azithromycin (ZITHROMAX Z-PAK) 250 MG tablet 2 pills today then 1 pill a day for 4 days 6 each 0  . ibuprofen (ADVIL) 200 MG tablet Take 200 mg by mouth every 6 (six) hours as needed. Reported on 02/20/2015    . Multiple Vitamin (MULTIVITAMIN) capsule Take 1 capsule by mouth daily. Reported on 02/20/2015     No current facility-administered medications on file prior to visit.     Social History  Substance Use Topics  . Smoking status: Never Smoker  . Smokeless tobacco: Never Used  . Alcohol use 1.2 oz/week    2 Standard drinks or equivalent per week     Comment: Occasionally    Review of Systems  Constitutional: Negative for chills and fever.  Respiratory: Positive for chest tightness and shortness of breath. Negative for cough and wheezing.   Cardiovascular: Negative for chest pain and palpitations.  Gastrointestinal: Negative for nausea and vomiting.  Psychiatric/Behavioral: Negative for sleep disturbance and suicidal ideas. The patient is nervous/anxious.       Objective:    BP 124/86   Pulse 84  Ht 5\' 7"  (1.702 m)   Wt 198 lb 9.6 oz (90.1 kg)   SpO2 98%   BMI 31.11 kg/m    Physical Exam  Constitutional: He appears well-developed and well-nourished.  Cardiovascular: Regular rhythm and normal heart sounds.   Pulmonary/Chest: Effort normal and breath sounds normal. No respiratory distress. He has no wheezes. He has no rhonchi. He has no rales.  Neurological: He is alert.  Skin: Skin is warm and dry.  Psychiatric: He has a normal mood and affect. His speech is normal and behavior is normal.  Vitals reviewed.      Assessment & Plan:      I have changed Eric Hodges ALPRAZolam. I am also having him maintain his multivitamin, ibuprofen,  azithromycin, and escitalopram.   Meds ordered this encounter  Medications  . escitalopram (LEXAPRO) 10 MG tablet    Sig: Take 1 tablet (10 mg total) by mouth daily.    Dispense:  90 tablet    Refill:  0    Order Specific Question:   Supervising Provider    Answer:   Deborra Medina L [[id number]]  . ALPRAZolam (XANAX) 0.25 MG tablet    Sig: Take 1 tablet (0.25 mg total) by mouth daily as needed for anxiety.    Dispense:  10 tablet    Refill:  0    Order Specific Question:   Supervising Provider    Answer:   Crecencio Mc [[id number]]    Return precautions given.   Risks, benefits, and alternatives of the medications and treatment plan prescribed today were discussed, and patient expressed understanding.   Education regarding symptom management and diagnosis given to patient on AVS.  Continue to follow with Coral Spikes, DO for routine health maintenance.   Eric Hodges agreed with plan.   Eric Paris, FNP

## 2015-12-04 NOTE — Progress Notes (Signed)
Pre visit review using our clinic review tool, if applicable. No additional management support is needed unless otherwise documented below in the visit note. 

## 2015-12-04 NOTE — Telephone Encounter (Signed)
Left a voice message with appt time for today with Mable Paris

## 2015-12-04 NOTE — Patient Instructions (Addendum)
F/u with CPE with Dr Lacinda Axon in 6-8 weeks  Trial celexa, prn xanax  Referral to cardiology.  If chest pain, numbness tingling left arm, please go to ED as discussed

## 2015-12-04 NOTE — Assessment & Plan Note (Signed)
Borderline normal to Prehypertensive. Discussed with patient why some modifications including weight loss, low salt diet. Follow blood pressure with his PCP.

## 2015-12-04 NOTE — Telephone Encounter (Signed)
Pt called and stated that he is having elevated heart rate, anxiety issues, and some weight gain. With the symptoms of anxiety and elevated heart rate sent call to [facility] triage.   Pt # [phone]

## 2015-12-04 NOTE — Assessment & Plan Note (Signed)
No acute cardiac symptoms at this time. episodes appear  correlated with anxiety. No acute symptoms today. I ordered an EKG however was informed EKG machine does not work in our clinic today. Patient and I had a long discussion about the signs and symptoms of MI and I advised him to go to emergency room if the symptoms were to recur. Due to symptom and family history, I placed a referral for cardiology as patient may benefit from Holter monitoring, stress test.

## 2015-12-04 NOTE — Assessment & Plan Note (Addendum)
Encouraged patient to trial Lexapro 10 and to try this medication for 6 weeks. He politely declined any counseling at this time. Discussed with patient the risk of benzodiazepines however for his acute panic attacks, I gave him a few tablets of medication use as needed and advised patient to follow-up PCP if he were to require, future refills.

## 2015-12-04 NOTE — Telephone Encounter (Signed)
Patient Name: Eric Hodges DOB: [birth year] Initial Comment Caller states he had blood sugar issues a while back. Now has gained weight, heart rate elevated. BP 136/91 this morning. Nurse Assessment Nurse: Ysidro Evert, RN, Shirlean Mylar Date/Time Eilene Ghazi Time): 12/04/2015 9:50:21 AM Confirm and document reason for call. If symptomatic, describe symptoms. You must click the next button to save text entered. ---Caller states he had blood sugar issues a while back. Now has gained weight, heart rate elevated. BP 136/91 this morning. Lately having night sweats, anxiety is through the roof. Has a broad spectrum of concerns. Does the patient have any new or worsening symptoms? ---Yes Will a triage be completed? ---Yes Related visit to physician within the last 2 weeks? ---No Does the PT have any chronic conditions? (i.e. diabetes, asthma, etc.) ---Yes List chronic conditions. ---HTN Is this a behavioral health or substance abuse call? ---No Guidelines Guideline Title Affirmed Question Affirmed Notes High Blood Pressure BP 120-139 / 80-89 (all triage questions negative) Final Disposition User See Physician within 24 Hours Ysidro Evert, RN, Robin Referrals REFERRED TO PCP OFFICE Disagree/Comply: Leta Baptist

## 2015-12-04 NOTE — Assessment & Plan Note (Signed)
History of elevated A1c's. Pending A1c today. Advised patient to follow-up PCP for CPE.

## 2015-12-04 NOTE — Telephone Encounter (Signed)
Noted, thanks!

## 2015-12-11 ENCOUNTER — Encounter: Payer: Self-pay | Admitting: Family Medicine

## 2015-12-11 ENCOUNTER — Ambulatory Visit (INDEPENDENT_AMBULATORY_CARE_PROVIDER_SITE_OTHER): Payer: 59 | Admitting: Family Medicine

## 2015-12-11 VITALS — BP 118/74 | HR 74 | Temp 98.2°F | Wt 198.4 lb

## 2015-12-11 DIAGNOSIS — R0789 Other chest pain: Secondary | ICD-10-CM

## 2015-12-11 DIAGNOSIS — F411 Generalized anxiety disorder: Secondary | ICD-10-CM | POA: Diagnosis not present

## 2015-12-11 DIAGNOSIS — R7989 Other specified abnormal findings of blood chemistry: Secondary | ICD-10-CM

## 2015-12-11 DIAGNOSIS — R945 Abnormal results of liver function studies: Secondary | ICD-10-CM

## 2015-12-11 LAB — COMPREHENSIVE METABOLIC PANEL
ALBUMIN: 4.8 g/dL (ref 3.5–5.2)
ALT: 39 U/L (ref 0–53)
AST: 25 U/L (ref 0–37)
Alkaline Phosphatase: 63 U/L (ref 39–117)
BUN: 14 mg/dL (ref 6–23)
CALCIUM: 10.5 mg/dL (ref 8.4–10.5)
CHLORIDE: 102 meq/L (ref 96–112)
CO2: 31 meq/L (ref 19–32)
Creatinine, Ser: 0.98 mg/dL (ref 0.40–1.50)
GFR: 92.25 mL/min (ref >60.00)
Glucose, Bld: 104 mg/dL — ABNORMAL HIGH (ref 70–99)
POTASSIUM: 5.1 meq/L (ref 3.5–5.1)
Sodium: 139 mEq/L (ref 135–145)
Total Bilirubin: 0.8 mg/dL (ref 0.2–1.2)
Total Protein: 7.4 g/dL (ref 6.0–8.3)

## 2015-12-11 NOTE — Assessment & Plan Note (Signed)
New problem. Noted on review of labs in the EMR. Rechecking today.

## 2015-12-11 NOTE — Assessment & Plan Note (Signed)
Stable. This is the patient's main issue at this point time. Continue Lexapro. Xanax as needed.

## 2015-12-11 NOTE — Patient Instructions (Signed)
Follow up in 6 weeks.  Continue your meds.  Take care  Dr. Lacinda Axon

## 2015-12-11 NOTE — Progress Notes (Signed)
Pre visit review using our clinic review tool, if applicable. No additional management support is needed unless otherwise documented below in the visit note. 

## 2015-12-11 NOTE — Progress Notes (Signed)
Subjective:  Patient ID: Eric Sevin., male    DOB: 01-11-1981  Age: 35 y.o. MRN: [id number]  CC: Follow up  HPI:  35 year old male with DM 2, hyper lipidemia, anxiety presents for follow-up.  Patient was recently seen on 11/21. He reported increasing anxiety, associated chest tightness and palpitations. Some mild shortness of breath with significant exertion.  He was encouraged to resume Lexapro. Cardiology referral was placed for potential concern for cardiac disease especially in the setting of family history of CAD.  He presents today for follow-up. He states that his anxiety is relatively stable. He is compliant with Lexapro. He is using Xanax as needed. He endorses intermittent chest tightness but this appears to be associated with anxiety. He's able to go up several flights of stairs without significant dyspnea. No other complaints or issues at this time.  Social Hx   Social History   Social History  . Marital status: Single    Spouse name: N/A  . Number of children: N/A  . Years of education: N/A   Social History Main Topics  . Smoking status: Never Smoker  . Smokeless tobacco: Never Used  . Alcohol use 1.2 oz/week    2 Standard drinks or equivalent per week     Comment: Occasionally  . Drug use: No  . Sexual activity: Not Asked   Other Topics Concern  . None   Social History Narrative  . None    Review of Systems  Constitutional: Negative.   Respiratory: Positive for chest tightness.   Psychiatric/Behavioral: The patient is nervous/anxious.    Objective:  BP 118/74   Pulse 74   Temp 98.2 F (36.8 C) (Oral)   Wt 198 lb 6.4 oz (90 kg)   SpO2 97%   BMI 31.07 kg/m   BP/Weight 12/11/2015 12/04/2015 0000000  Systolic BP 123456 [id number]  Diastolic BP 74 86 86  Wt. (Lbs) 198.4 198.6 -  BMI 31.07 31.11 -   Physical Exam  Constitutional: He is oriented to person, place, and time. He appears well-developed. No distress.  Cardiovascular: Normal rate  and regular rhythm.   Pulmonary/Chest: Effort normal and breath sounds normal.  Neurological: He is alert and oriented to person, place, and time.  Psychiatric: He has a normal mood and affect.  Vitals reviewed.  Lab Results  Component Value Date   WBC 9.6 02/24/2015   HGB 15.2 02/24/2015   HCT 45.6 02/24/2015   PLT 257 02/24/2015   GLUCOSE 114 (H) 02/24/2015   CHOL 230 (H) 02/02/2015   TRIG 328.0 (H) 02/02/2015   HDL 38.50 (L) 02/02/2015   LDLDIRECT 145.0 02/02/2015   LDLCALC 149 (H) 05/05/2013   ALT 292 (H) 02/24/2015   AST 85 (H) 02/24/2015   NA 138 02/24/2015   K 4.0 02/24/2015   CL 104 02/24/2015   CREATININE 0.97 02/24/2015   BUN 17 02/24/2015   CO2 29 02/24/2015   TSH 3.09 02/02/2015   HGBA1C 6.0 12/04/2015   MICROALBUR <0.7 02/02/2015    Assessment & Plan:   Problem List Items Addressed This Visit    Generalized anxiety disorder - Primary    Stable. This is the patient's main issue at this point time. Continue Lexapro. Xanax as needed.      Elevated LFTs    New problem. Noted on review of labs in the EMR. Rechecking today.      Relevant Orders   Comprehensive metabolic panel   Chest tightness    Stable. Low  likelihood for cardiac etiology. We had an in-depth discussion about this today. This appears to be secondary to anxiety. Counseling cardiac workup. We'll continue to monitor.         Follow-up: Return in about 6 weeks (around 01/22/2016).  [place]

## 2015-12-11 NOTE — Assessment & Plan Note (Signed)
Stable. Low likelihood for cardiac etiology. We had an in-depth discussion about this today. This appears to be secondary to anxiety. Counseling cardiac workup. We'll continue to monitor.

## 2015-12-12 ENCOUNTER — Ambulatory Visit: Payer: Self-pay | Admitting: Internal Medicine

## 2015-12-28 ENCOUNTER — Ambulatory Visit: Payer: Self-pay | Admitting: Physician Assistant

## 2015-12-28 ENCOUNTER — Encounter: Payer: Self-pay | Admitting: Physician Assistant

## 2015-12-28 ENCOUNTER — Encounter: Payer: Self-pay | Admitting: Family Medicine

## 2015-12-28 VITALS — BP 120/84 | HR 84 | Temp 98.4°F

## 2015-12-28 DIAGNOSIS — J069 Acute upper respiratory infection, unspecified: Secondary | ICD-10-CM

## 2015-12-28 MED ORDER — AMOXICILLIN 875 MG PO TABS: 875.0000 mg | ORAL_TABLET | Freq: Two times a day (BID) | ORAL | 0 refills | Status: DC

## 2015-12-28 NOTE — Progress Notes (Signed)
S: C/o runny nose and congestion for 3 days, no fever, chills, cp/sob, v/d; mucus was green this am but clear throughout the day, cough is sporadic, wife has strep  Using otc meds:   O: PE: vitals wnl, nad, perrl eomi, normocephalic, tms dull, nasal mucosa red and swollen, throat injected, neck supple no lymph, lungs c t a, cv rrr, neuro intact  A:  Acute uri   P: drink fluids, continue regular meds , use otc meds of choice, return if not improving in 5 days, return earlier if worsening , amoxil, mucinex

## 2016-01-25 ENCOUNTER — Ambulatory Visit (INDEPENDENT_AMBULATORY_CARE_PROVIDER_SITE_OTHER): Payer: 59 | Admitting: Family Medicine

## 2016-01-25 ENCOUNTER — Encounter: Payer: Self-pay | Admitting: Family Medicine

## 2016-01-25 VITALS — BP 120/88 | HR 92 | Temp 98.4°F | Resp 14 | Wt 195.6 lb

## 2016-01-25 DIAGNOSIS — E119 Type 2 diabetes mellitus without complications: Secondary | ICD-10-CM | POA: Diagnosis not present

## 2016-01-25 DIAGNOSIS — E785 Hyperlipidemia, unspecified: Secondary | ICD-10-CM | POA: Diagnosis not present

## 2016-01-25 DIAGNOSIS — F411 Generalized anxiety disorder: Secondary | ICD-10-CM | POA: Diagnosis not present

## 2016-01-25 LAB — LIPID PANEL
CHOL/HDL RATIO: 6
Cholesterol: 200 mg/dL (ref 0–200)
HDL: 35.7 mg/dL — AB (ref >39.00)
NonHDL: 163.8
TRIGLYCERIDES: 222 mg/dL — AB (ref 0.0–149.0)
VLDL: 44.4 mg/dL — ABNORMAL HIGH (ref 0.0–40.0)

## 2016-01-25 LAB — LDL CHOLESTEROL, DIRECT: Direct LDL: 116 mg/dL

## 2016-01-25 NOTE — Progress Notes (Deleted)
   Subjective:  Patient ID: Eric Sevin., male    DOB: 08-20-1980  Age: 36 y.o. MRN: [id number]  CC:   HPI:   Social Hx   Social History   Social History  . Marital status: Single    Spouse name: N/A  . Number of children: N/A  . Years of education: N/A   Social History Main Topics  . Smoking status: Never Smoker  . Smokeless tobacco: Never Used  . Alcohol use 1.2 oz/week    2 Standard drinks or equivalent per week     Comment: Occasionally  . Drug use: No  . Sexual activity: Not Asked   Other Topics Concern  . None   Social History Narrative  . None    Review of Systems   Objective:  BP 120/88   Pulse 92   Temp 98.4 F (36.9 C) (Oral)   Resp 14   Wt 195 lb 9.6 oz (88.7 kg)   SpO2 98%   BMI 30.64 kg/m   BP/Weight 01/25/2016 12/28/2015 0000000  Systolic BP 123456 123456 123456  Diastolic BP 88 84 74  Wt. (Lbs) 195.6 - 198.4  BMI 30.64 - 31.07    Physical Exam  Lab Results  Component Value Date   WBC 9.6 02/24/2015   HGB 15.2 02/24/2015   HCT 45.6 02/24/2015   PLT 257 02/24/2015   GLUCOSE 104 (H) 12/11/2015   CHOL 230 (H) 02/02/2015   TRIG 328.0 (H) 02/02/2015   HDL 38.50 (L) 02/02/2015   LDLDIRECT 145.0 02/02/2015   LDLCALC 149 (H) 05/05/2013   ALT 39 12/11/2015   AST 25 12/11/2015   NA 139 12/11/2015   K 5.1 12/11/2015   CL 102 12/11/2015   CREATININE 0.98 12/11/2015   BUN 14 12/11/2015   CO2 31 12/11/2015   TSH 3.09 02/02/2015   HGBA1C 6.0 12/04/2015   MICROALBUR <0.7 02/02/2015    Assessment & Plan:   Problem List Items Addressed This Visit    HLD (hyperlipidemia) - Primary   Relevant Orders   Lipid panel      No orders of the defined types were placed in this encounter.   Follow-up: No Follow-up on file.  [place]

## 2016-01-25 NOTE — Progress Notes (Signed)
   Subjective:  Patient ID: Eric Sevin., male    DOB: [birth year]  Age: 36 y.o. MRN: [id number]  CC: Follow up  HPI:  36 year old male with obesity, DM 2, hyperlipidemia, GAD presents for follow-up.  GAD  Doing well at this time.  Has stopped Lexapro.  Using Xanax rarely.  DM-2  Well controlled with lifestyle changes.  Last A1C was 6.0 (11/2015).  HLD  Has been stable.  Untreated. We have discussed statin therapy and will revisit today.  Social Hx   Social History   Social History  . Marital status: Single    Spouse name: N/A  . Number of children: N/A  . Years of education: N/A   Social History Main Topics  . Smoking status: Never Smoker  . Smokeless tobacco: Never Used  . Alcohol use 1.2 oz/week    2 Standard drinks or equivalent per week     Comment: Occasionally  . Drug use: No  . Sexual activity: Not Asked   Other Topics Concern  . None   Social History Narrative  . None   Review of Systems  Constitutional: Negative.   Psychiatric/Behavioral: The patient is nervous/anxious.    Objective:  BP 120/88   Pulse 92   Temp 98.4 F (36.9 C) (Oral)   Resp 14   Wt 195 lb 9.6 oz (88.7 kg)   SpO2 98%   BMI 30.64 kg/m   BP/Weight 01/25/2016 12/28/2015 0000000  Systolic BP 123456 123456 123456  Diastolic BP 88 84 74  Wt. (Lbs) 195.6 - 198.4  BMI 30.64 - 31.07   Physical Exam  Constitutional: He is oriented to person, place, and time. He appears well-developed. No distress.  Cardiovascular: Normal rate and regular rhythm.   Pulmonary/Chest: Effort normal and breath sounds normal.  Neurological: He is alert and oriented to person, place, and time.  Psychiatric: He has a normal mood and affect.  Vitals reviewed.  Lab Results  Component Value Date   WBC 9.6 02/24/2015   HGB 15.2 02/24/2015   HCT 45.6 02/24/2015   PLT 257 02/24/2015   GLUCOSE 104 (H) 12/11/2015   CHOL 230 (H) 02/02/2015   TRIG 328.0 (H) 02/02/2015   HDL 38.50 (L) 02/02/2015     LDLDIRECT 145.0 02/02/2015   LDLCALC 149 (H) 05/05/2013   ALT 39 12/11/2015   AST 25 12/11/2015   NA 139 12/11/2015   K 5.1 12/11/2015   CL 102 12/11/2015   CREATININE 0.98 12/11/2015   BUN 14 12/11/2015   CO2 31 12/11/2015   TSH 3.09 02/02/2015   HGBA1C 6.0 12/04/2015   MICROALBUR <0.7 02/02/2015    Assessment & Plan:   Problem List Items Addressed This Visit    HLD (hyperlipidemia) - Primary    Discussed statin therapy. Rechecking lipid today. Holding off on statin at this time.      Relevant Orders   Lipid panel   Generalized anxiety disorder    Stable. Continue PRN Xanax.      Diabetes mellitus, type 2 (HCC)    Stable. Continue diet and exercise.        Follow-up: 6 months  [place] DO [facility]

## 2016-01-25 NOTE — Progress Notes (Signed)
Pre visit review using our clinic review tool, if applicable. No additional management support is needed unless otherwise documented below in the visit note. 

## 2016-01-25 NOTE — Patient Instructions (Signed)
We will call with your results.  Continue to exercise. Eat healthy.  Follow up in 6 months  Take care  Dr. Lacinda Axon

## 2016-01-25 NOTE — Assessment & Plan Note (Signed)
Stable Continue diet and exercise

## 2016-01-25 NOTE — Assessment & Plan Note (Signed)
Discussed statin therapy. Rechecking lipid today. Holding off on statin at this time.

## 2016-01-25 NOTE — Assessment & Plan Note (Signed)
Stable. Continue PRN Xanax.

## 2016-01-28 ENCOUNTER — Encounter: Payer: Self-pay | Admitting: Family Medicine

## 2016-04-21 ENCOUNTER — Ambulatory Visit (INDEPENDENT_AMBULATORY_CARE_PROVIDER_SITE_OTHER): Payer: 59 | Admitting: Family Medicine

## 2016-04-21 VITALS — BP 122/86 | HR 82 | Temp 98.2°F | Wt 202.6 lb

## 2016-04-21 DIAGNOSIS — R319 Hematuria, unspecified: Secondary | ICD-10-CM | POA: Diagnosis not present

## 2016-04-21 LAB — POCT URINALYSIS DIPSTICK
Bilirubin, UA: NEGATIVE
Glucose, UA: NEGATIVE
KETONES UA: NEGATIVE
Leukocytes, UA: NEGATIVE
PH UA: 6.5 (ref 5.0–8.0)
RBC UA: NEGATIVE
SPEC GRAV UA: 1.005 (ref 1.030–1.035)
UROBILINOGEN UA: 0.2 (ref ?–2.0)

## 2016-04-21 LAB — URINALYSIS, MICROSCOPIC ONLY
RBC / HPF: NONE SEEN (ref 0–?)
WBC, UA: NONE SEEN (ref 0–?)

## 2016-04-21 NOTE — Patient Instructions (Signed)
We will call with the results.  Try and return for a UA tomorrow.  Take care  Dr. Lacinda Axon

## 2016-04-21 NOTE — Progress Notes (Signed)
Pre visit review using our clinic review tool, if applicable. No additional management support is needed unless otherwise documented below in the visit note. 

## 2016-04-21 NOTE — Assessment & Plan Note (Signed)
New problem. Patient reporting hematuria. UA negative today. Has had some recent back pain, but his history is not suggestive of kidney stone pain Unclear etiology/prognosis at this time. Sending urine for microscopy and culture. Reassurance provided.

## 2016-04-21 NOTE — Progress Notes (Signed)
Subjective:  Patient ID: Eric Hodges., male    DOB: 1980/03/12  Age: 36 y.o. MRN: [id number]  CC: Pink urine  HPI:  36 year old male presents with the above complaint.  Patient states over the past 3 days he's noticed that his urine has had a pinkish coloration. Occurs with the first morning void and then resolves. He states it has been slowly improving. No associated dysuria, urgency, frequency. No discharge from the penis. He is in a monogamous relationship. He reports he's had some recent right low back/flank pain. He describes this as mild. No radiation. No fever. No other associated symptoms. No other complaints or concerns at this time.   Social Hx   Social History   Social History  . Marital status: Single    Spouse name: N/A  . Number of children: N/A  . Years of education: N/A   Social History Main Topics  . Smoking status: Never Smoker  . Smokeless tobacco: Never Used  . Alcohol use 1.2 oz/week    2 Standard drinks or equivalent per week     Comment: Occasionally  . Drug use: No  . Sexual activity: Not on file   Other Topics Concern  . Not on file   Social History Narrative  . No narrative on file    Review of Systems  Constitutional: Negative for fever.  Genitourinary: Negative for frequency and urgency.  Musculoskeletal: Positive for back pain.   Objective:  BP 122/86   Pulse 82   Temp 98.2 F (36.8 C)   Wt 202 lb 9.6 oz (91.9 kg)   SpO2 98%   BMI 31.73 kg/m   BP/Weight 04/21/2016 01/25/2016 67/12/4578  Systolic BP 998 338 250  Diastolic BP 86 88 84  Wt. (Lbs) 202.6 195.6 -  BMI 31.73 30.64 -   Physical Exam  Constitutional: He is oriented to person, place, and time. He appears well-developed. No distress.  Cardiovascular: Normal rate and regular rhythm.   Pulmonary/Chest: Effort normal and breath sounds normal.  Abdominal: Soft. He exhibits no distension. There is no tenderness. There is no rebound and no guarding.  Neurological: He is  alert and oriented to person, place, and time.  Psychiatric:  Anxious.  Vitals reviewed.   Lab Results  Component Value Date   WBC 9.6 02/24/2015   HGB 15.2 02/24/2015   HCT 45.6 02/24/2015   PLT 257 02/24/2015   GLUCOSE 104 (H) 12/11/2015   CHOL 200 01/25/2016   TRIG 222.0 (H) 01/25/2016   HDL 35.70 (L) 01/25/2016   LDLDIRECT 116.0 01/25/2016   LDLCALC 149 (H) 05/05/2013   ALT 39 12/11/2015   AST 25 12/11/2015   NA 139 12/11/2015   K 5.1 12/11/2015   CL 102 12/11/2015   CREATININE 0.98 12/11/2015   BUN 14 12/11/2015   CO2 31 12/11/2015   TSH 3.09 02/02/2015   HGBA1C 6.0 12/04/2015   MICROALBUR <0.7 02/02/2015    Assessment & Plan:   Problem List Items Addressed This Visit    Hematuria - Primary    New problem. Patient reporting hematuria. UA negative today. Has had some recent back pain, but his history is not suggestive of kidney stone pain Unclear etiology/prognosis at this time. Sending urine for microscopy and culture. Reassurance provided.      Relevant Orders   POCT Urinalysis Dipstick (Completed)   Urine Microscopic Only   Urine culture     Follow-up: PRN  [place]

## 2016-04-22 ENCOUNTER — Other Ambulatory Visit (INDEPENDENT_AMBULATORY_CARE_PROVIDER_SITE_OTHER): Payer: 59

## 2016-04-22 ENCOUNTER — Telehealth: Payer: Self-pay | Admitting: Radiology

## 2016-04-22 ENCOUNTER — Other Ambulatory Visit: Payer: Self-pay

## 2016-04-22 DIAGNOSIS — Z1389 Encounter for screening for other disorder: Secondary | ICD-10-CM | POA: Diagnosis not present

## 2016-04-22 DIAGNOSIS — R3989 Other symptoms and signs involving the genitourinary system: Secondary | ICD-10-CM

## 2016-04-22 LAB — POCT URINALYSIS DIPSTICK
Bilirubin, UA: NEGATIVE
Glucose, UA: NEGATIVE
KETONES UA: NEGATIVE
Leukocytes, UA: NEGATIVE
NITRITE UA: NEGATIVE
PH UA: 6 (ref 5.0–8.0)
Protein, UA: NEGATIVE
RBC UA: NEGATIVE
Spec Grav, UA: 1.02 (ref 1.030–1.035)
Urobilinogen, UA: 0.2 (ref ?–2.0)

## 2016-04-22 LAB — URINE CULTURE: ORGANISM ID, BACTERIA: NO GROWTH

## 2016-04-22 NOTE — Addendum Note (Signed)
Addended by: Arby Barrette on: 04/22/2016 03:07 PM   Modules accepted: Orders

## 2016-04-22 NOTE — Telephone Encounter (Signed)
NOTE: Pt dropped off urine before we got here at 8 this morning. The cup was full (again).

## 2016-04-22 NOTE — Telephone Encounter (Signed)
Pt came and dropped off urine specimen. Please place future orders. Thank you

## 2016-04-29 ENCOUNTER — Encounter: Payer: Self-pay | Admitting: Family Medicine

## 2016-07-25 ENCOUNTER — Ambulatory Visit: Payer: Self-pay | Admitting: Family Medicine

## 2016-08-07 ENCOUNTER — Encounter: Payer: Self-pay | Admitting: Physician Assistant

## 2016-08-07 ENCOUNTER — Ambulatory Visit: Payer: Self-pay | Admitting: Physician Assistant

## 2016-08-07 ENCOUNTER — Ambulatory Visit
Admission: RE | Admit: 2016-08-07 | Discharge: 2016-08-07 | Disposition: A | Discharge status: Home / Self Care | Payer: 59 | Source: Ambulatory Visit | Attending: Physician Assistant | Admitting: Physician Assistant

## 2016-08-07 VITALS — BP 114/90 | HR 99 | Temp 98.3°F

## 2016-08-07 DIAGNOSIS — R05 Cough: Secondary | ICD-10-CM | POA: Insufficient documentation

## 2016-08-07 DIAGNOSIS — R059 Cough, unspecified: Secondary | ICD-10-CM

## 2016-08-07 MED ORDER — RANITIDINE HCL 150 MG PO TABS: 150.0000 mg | ORAL_TABLET | Freq: Two times a day (BID) | ORAL | 12 refills | Status: DC

## 2016-08-07 MED ORDER — ALBUTEROL SULFATE HFA 108 (90 BASE) MCG/ACT IN AERS: 2.0000 | INHALATION_SPRAY | Freq: Four times a day (QID) | RESPIRATORY_TRACT | 12 refills | Status: AC | PRN

## 2016-08-07 NOTE — Progress Notes (Signed)
S: pt states he has had a cough for more than a month but doesn't feel sick, denies fever/chils, states his chest is burning a lot, no hx of reflux, no wheezing, no hx of asthma, states he has never been a smoker but his parents smoked when he was a kid, states the sx are worse when he is sedentary, actually feels better after being active  O: vitals wnl, nad, lungs c t a, cv rrr  A: cough greater than one month  P: cxr, zantac , albuterol bid

## 2016-08-15 ENCOUNTER — Other Ambulatory Visit: Payer: Self-pay | Admitting: Family Medicine

## 2016-08-15 ENCOUNTER — Ambulatory Visit: Payer: Self-pay | Admitting: Family Medicine

## 2016-08-15 DIAGNOSIS — F411 Generalized anxiety disorder: Secondary | ICD-10-CM

## 2016-08-15 NOTE — Telephone Encounter (Signed)
Pt called needing a refill for ALPRAZolam (XANAX) 0.25 MG tablet. Pt will be leaving town in one hour.  Pharmacy is [id number] - [place], [place]  Call pt @ [phone]. Thank you!

## 2016-08-15 NOTE — Telephone Encounter (Signed)
Last office 01/25/16 No office visit scheduled , appointment cancelled today

## 2016-08-18 MED ORDER — ALPRAZOLAM 0.25 MG PO TABS: 0.2500 mg | ORAL_TABLET | Freq: Every day | ORAL | 0 refills | Status: AC | PRN

## 2016-08-18 NOTE — Telephone Encounter (Signed)
Script faxed.

## 2016-09-11 DIAGNOSIS — L578 Other skin changes due to chronic exposure to nonionizing radiation: Secondary | ICD-10-CM | POA: Diagnosis not present

## 2016-09-11 DIAGNOSIS — Z1283 Encounter for screening for malignant neoplasm of skin: Secondary | ICD-10-CM | POA: Diagnosis not present

## 2016-09-11 DIAGNOSIS — D485 Neoplasm of uncertain behavior of skin: Secondary | ICD-10-CM | POA: Diagnosis not present

## 2016-09-11 DIAGNOSIS — D1800 Hemangioma unspecified site: Secondary | ICD-10-CM | POA: Diagnosis not present

## 2016-09-11 DIAGNOSIS — L298 Other pruritus: Secondary | ICD-10-CM | POA: Diagnosis not present

## 2016-09-30 ENCOUNTER — Encounter: Payer: Self-pay | Admitting: Family Medicine

## 2016-09-30 ENCOUNTER — Ambulatory Visit (INDEPENDENT_AMBULATORY_CARE_PROVIDER_SITE_OTHER): Payer: 59 | Admitting: Family Medicine

## 2016-09-30 VITALS — BP 118/90 | HR 89 | Temp 98.3°F | Wt 201.0 lb

## 2016-09-30 DIAGNOSIS — R1012 Left upper quadrant pain: Secondary | ICD-10-CM | POA: Diagnosis not present

## 2016-09-30 DIAGNOSIS — R1013 Epigastric pain: Secondary | ICD-10-CM

## 2016-09-30 DIAGNOSIS — R7989 Other specified abnormal findings of blood chemistry: Secondary | ICD-10-CM

## 2016-09-30 DIAGNOSIS — E119 Type 2 diabetes mellitus without complications: Secondary | ICD-10-CM | POA: Diagnosis not present

## 2016-09-30 MED ORDER — PANTOPRAZOLE SODIUM 40 MG PO TBEC: 40.0000 mg | DELAYED_RELEASE_TABLET | Freq: Every day | ORAL | 1 refills | Status: AC

## 2016-09-30 NOTE — Assessment & Plan Note (Signed)
New problem. Noted on exam. 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 No alcohol.  Starting on Protonix.

## 2016-09-30 NOTE — Assessment & Plan Note (Signed)
New problem. Likely secondary to alcohol induced gastritis. No alcohol. PPI.

## 2016-09-30 NOTE — Patient Instructions (Addendum)
Protonix as prescribed.  No alcohol.  We will call with lab results.  Take care  Dr. Lacinda Axon

## 2016-09-30 NOTE — Progress Notes (Signed)
Subjective:  Patient ID: Eric Hodges., male    DOB: 27-Jan-1980  Age: 36 y.o. MRN: [id number]  CC: LUQ pain  HPI:  36 year old male presents with left upper quadrant pain.  Patient states that his pain started last week. Left upper quadrant pain. Moderate in severity. Worse after eating. No known relieving factors. No reports of hematochezia or melena. No vomiting. He does report that he has recently drank a lot of alcohol. No other associative symptoms. No other complaints or concerns at this time.  Social Hx   Social History   Social History  . Marital status: Single    Spouse name: N/A  . Number of children: N/A  . Years of education: N/A   Social History Main Topics  . Smoking status: Never Smoker  . Smokeless tobacco: Never Used  . Alcohol use 1.2 oz/week    2 Standard drinks or equivalent per week     Comment: Occasionally  . Drug use: No  . Sexual activity: Not Asked   Other Topics Concern  . None   Social History Narrative  . None    Review of Systems  Constitutional: Negative.   Gastrointestinal: Positive for abdominal pain. Negative for blood in stool, nausea and vomiting.   Objective:  BP 118/90   Pulse 89   Temp 98.3 F (36.8 C) (Oral)   Wt 201 lb (91.2 kg)   SpO2 96%   BMI 31.48 kg/m   BP/Weight 09/30/2016 6/54/6503 05/17/6566  Systolic BP 127 517 001  Diastolic BP 90 90 86  Wt. (Lbs) 201 - 202.6  BMI 31.48 - 31.73   Physical Exam  Constitutional: He is oriented to person, place, and time. He appears well-developed. No distress.  Cardiovascular: Normal rate and regular rhythm.   No murmur heard. Pulmonary/Chest: Effort normal. He has no wheezes. He has no rales.  Abdominal: Soft. He exhibits no distension.  Tender palpation in the epigastric region. Mild tenderness in the left upper quadrant. No rebound or guarding. No mass.  Neurological: He is alert and oriented to person, place, and time.  Psychiatric: He has a normal mood and  affect.  Vitals reviewed.   Lab Results  Component Value Date   WBC 9.6 02/24/2015   HGB 15.2 02/24/2015   HCT 45.6 02/24/2015   PLT 257 02/24/2015   GLUCOSE 104 (H) 12/11/2015   CHOL 200 01/25/2016   TRIG 222.0 (H) 01/25/2016   HDL 35.70 (L) 01/25/2016   LDLDIRECT 116.0 01/25/2016   LDLCALC 149 (H) 05/05/2013   ALT 39 12/11/2015   AST 25 12/11/2015   NA 139 12/11/2015   K 5.1 12/11/2015   CL 102 12/11/2015   CREATININE 0.98 12/11/2015   BUN 14 12/11/2015   CO2 31 12/11/2015   TSH 3.09 02/02/2015   HGBA1C 6.0 12/04/2015   MICROALBUR <0.7 02/02/2015    Assessment & Plan:   Problem List Items Addressed This Visit    Diabetes mellitus, type 2 ([place])   Relevant Orders   Hemoglobin A1c   Comprehensive metabolic panel   Lipid panel   Epigastric pain - Primary    New problem. Noted on exam. 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 No alcohol.  Starting on Protonix.      Relevant Orders   CBC   Lipase   LUQ pain    New problem. Likely secondary to alcohol induced gastritis. No alcohol. PPI.      Relevant Orders   CBC   Lipase  Meds ordered this encounter  Medications  . pantoprazole (PROTONIX) 40 MG tablet    Sig: Take 1 tablet (40 mg total) by mouth daily.    Dispense:  30 tablet    Refill:  1     Follow-up: PRN  [place]

## 2016-10-01 LAB — COMPREHENSIVE METABOLIC PANEL
ALBUMIN: 4.7 g/dL (ref 3.5–5.2)
ALT: 41 U/L (ref 0–53)
AST: 27 U/L (ref 0–37)
Alkaline Phosphatase: 65 U/L (ref 39–117)
BUN: 19 mg/dL (ref 6–23)
CALCIUM: 10.3 mg/dL (ref 8.4–10.5)
CO2: 28 meq/L (ref 19–32)
CREATININE: 1.03 mg/dL (ref 0.40–1.50)
Chloride: 101 mEq/L (ref 96–112)
GFR: 86.7 mL/min (ref >60.00)
Glucose, Bld: 92 mg/dL (ref 70–99)
POTASSIUM: 4.2 meq/L (ref 3.5–5.1)
Sodium: 137 mEq/L (ref 135–145)
Total Bilirubin: 0.6 mg/dL (ref 0.2–1.2)
Total Protein: 7.6 g/dL (ref 6.0–8.3)

## 2016-10-01 LAB — CBC
HEMATOCRIT: 45.6 % (ref 39.0–52.0)
HEMOGLOBIN: 15.3 g/dL (ref 13.0–17.0)
MCHC: 33.6 g/dL (ref 30.0–36.0)
MCV: 87.6 fl (ref 78.0–100.0)
PLATELETS: 264 10*3/uL (ref 150.0–400.0)
RBC: 5.2 Mil/uL (ref 4.22–5.81)
RDW: 13 % (ref 11.5–15.5)
WBC: 8.2 10*3/uL (ref 4.0–10.5)

## 2016-10-01 LAB — HEMOGLOBIN A1C: Hgb A1c MFr Bld: 6.2 % (ref 4.6–6.5)

## 2016-10-01 LAB — LIPID PANEL
CHOL/HDL RATIO: 6
CHOLESTEROL: 264 mg/dL — AB (ref 0–200)
HDL: 43.4 mg/dL (ref >39.00)

## 2016-10-01 LAB — LDL CHOLESTEROL, DIRECT: LDL DIRECT: 126 mg/dL

## 2016-10-01 LAB — LIPASE: LIPASE: 49 U/L (ref 11.0–59.0)

## 2016-10-02 ENCOUNTER — Telehealth: Payer: Self-pay | Admitting: Family Medicine

## 2016-10-02 ENCOUNTER — Other Ambulatory Visit: Payer: Self-pay | Admitting: Family Medicine

## 2016-10-02 ENCOUNTER — Encounter: Payer: Self-pay | Admitting: Family Medicine

## 2016-10-02 MED ORDER — ATORVASTATIN CALCIUM 20 MG PO TABS: 20.0000 mg | ORAL_TABLET | Freq: Every day | ORAL | 3 refills | Status: DC

## 2016-10-02 NOTE — Telephone Encounter (Signed)
Patients phone call returned in regards to lab results, see documentation under lab results.

## 2016-10-02 NOTE — Telephone Encounter (Signed)
Left voice mail to call back 

## 2016-10-02 NOTE — Telephone Encounter (Signed)
Pt called back returning your call. Please advise, thank you!  Call pt @ [phone]

## 2016-10-15 DIAGNOSIS — E119 Type 2 diabetes mellitus without complications: Secondary | ICD-10-CM | POA: Diagnosis not present

## 2016-10-20 ENCOUNTER — Telehealth: Payer: Self-pay | Admitting: *Deleted

## 2016-10-20 NOTE — Telephone Encounter (Signed)
No appointments available, please triage

## 2016-10-20 NOTE — Telephone Encounter (Signed)
LMTCB

## 2016-10-20 NOTE — Telephone Encounter (Signed)
Pt will like to reestablish with Dr.Sonnenberg. He started a new medication and would like to be seen soon(this week) to have his liver and kidney function checked.Pt is having lower back pain . Please advise at time and date  Pt contact [phone]

## 2016-10-20 NOTE — Telephone Encounter (Signed)
Patient scheduled for 10/15 @ 8:30

## 2016-10-24 ENCOUNTER — Encounter: Payer: Self-pay | Admitting: Family Medicine

## 2016-10-24 ENCOUNTER — Ambulatory Visit (INDEPENDENT_AMBULATORY_CARE_PROVIDER_SITE_OTHER): Payer: 59 | Admitting: Family Medicine

## 2016-10-24 VITALS — BP 120/70 | HR 90 | Temp 97.9°F | Ht 67.0 in | Wt 201.8 lb

## 2016-10-24 DIAGNOSIS — E785 Hyperlipidemia, unspecified: Secondary | ICD-10-CM

## 2016-10-24 DIAGNOSIS — E119 Type 2 diabetes mellitus without complications: Secondary | ICD-10-CM | POA: Diagnosis not present

## 2016-10-24 DIAGNOSIS — R079 Chest pain, unspecified: Secondary | ICD-10-CM

## 2016-10-24 DIAGNOSIS — R1013 Epigastric pain: Secondary | ICD-10-CM | POA: Diagnosis not present

## 2016-10-24 DIAGNOSIS — F411 Generalized anxiety disorder: Secondary | ICD-10-CM | POA: Diagnosis not present

## 2016-10-24 DIAGNOSIS — R944 Abnormal results of kidney function studies: Secondary | ICD-10-CM

## 2016-10-24 LAB — POCT URINALYSIS DIPSTICK
BILIRUBIN UA: NEGATIVE
Glucose, UA: NEGATIVE
Ketones, UA: NEGATIVE
Leukocytes, UA: NEGATIVE
Nitrite, UA: NEGATIVE
PH UA: 7 (ref 5.0–8.0)
Protein, UA: NEGATIVE
RBC UA: NEGATIVE
SPEC GRAV UA: 1.01 (ref 1.010–1.025)
UROBILINOGEN UA: 0.2 U/dL

## 2016-10-24 NOTE — Assessment & Plan Note (Signed)
Has occasional panic attacks. Responses Xanax. He'll continue as needed use. If symptoms worsen he'll let us know.

## 2016-10-24 NOTE — Assessment & Plan Note (Signed)
Previously treated with Protonix for gastritis. He notes his symptoms have resolved. He can discontinue this and if they recur he will contact us and restart the Protonix.

## 2016-10-24 NOTE — Assessment & Plan Note (Signed)
None recently though has had this in the past. Notes no change in symptoms since his last EKG. Most recent EKG reviewed. Does have a family history of early cardiac disease. We'll refer to cardiology for evaluation. Discussed if he develops more frequent symptoms or persistent symptoms he needs to be evaluated immediately.

## 2016-10-24 NOTE — Assessment & Plan Note (Signed)
Diet controlled. Encouraged to get back on the diet train.

## 2016-10-24 NOTE — Patient Instructions (Signed)
Nice to see you. We'll get some lab work today.

## 2016-10-24 NOTE — Assessment & Plan Note (Signed)
Check lab work. Consider switching to Crestor.

## 2016-10-24 NOTE — Progress Notes (Signed)
  Eric Rumps, MD Phone: [phone]  Eric Hodges. is a 36 y.o. male who presents today for f/u.  Diabetes: Managed through diet changes. His diet has gotten worse recently. No polyuria or polydipsia.  Hyperlipidemia: Recently started on Lipitor. Possibly some myalgias. No right upper quadrant pain. He does note intermittent chest pain for several years with extreme exertion. Otherwise no chest pain. He does report a history of MI in his grandfather at age 80. No recent chest pain.  Anxiety: Takes Xanax infrequently for panic attacks where he feels as though he has trouble breathing and his chest gets tight. Immediately the Xanax helps his symptoms. No depression  He reports he is concerned about his GFR being slightly decreased. He's had UAs previously with no blood or protein. No NSAID use. Does report occasional dark urine.   PMH: nonsmoker.   ROS see history of present illness  Objective  Physical Exam Vitals:   10/24/16 1509  BP: 120/70  Pulse: 90  Temp: 97.9 F (36.6 C)  SpO2: 98%    BP Readings from Last 3 Encounters:  10/24/16 120/70  09/30/16 118/90  08/07/16 114/90   Wt Readings from Last 3 Encounters:  10/24/16 201 lb 12.8 oz (91.5 kg)  09/30/16 201 lb (91.2 kg)  04/21/16 202 lb 9.6 oz (91.9 kg)    Physical Exam  Constitutional: No distress.  Cardiovascular: Normal rate, regular rhythm and normal heart sounds.   Pulmonary/Chest: Effort normal and breath sounds normal.  Abdominal: Soft. Bowel sounds are normal. He exhibits no distension. There is no tenderness. There is no rebound and no guarding.  Musculoskeletal: He exhibits no edema.  Neurological: He is alert. Gait normal.  Skin: Skin is warm and dry. He is not diaphoretic.     Assessment/Plan: Please see individual problem list.  Diabetes mellitus, type 2 Diet controlled. Encouraged to get back on the diet train.  Epigastric pain Previously treated with Protonix for gastritis. He  notes his symptoms have resolved. He can discontinue this and if they recur he will contact us and restart the Protonix.  Generalized anxiety disorder Has occasional panic attacks. Responses Xanax. He'll continue as needed use. If symptoms worsen he'll let us know.  HLD (hyperlipidemia) Check lab work. Consider switching to Crestor.  Exertional chest pain None recently though has had this in the past. Notes no change in symptoms since his last EKG. Most recent EKG reviewed. Does have a family history of early cardiac disease. We'll refer to cardiology for evaluation. Discussed if he develops more frequent symptoms or persistent symptoms he needs to be evaluated immediately.   Orders Placed This Encounter  Procedures  . LDL cholesterol, direct  . Comp Met (CMET)  . CK (Creatine Kinase)  . Ambulatory referral to Cardiology    Referral Priority:   Routine    Referral Type:   Consultation    Referral Reason:   Specialty Services Required    Requested Specialty:   Cardiology    Number of Visits Requested:   1  . POCT Urinalysis Dipstick    Eric Rumps, MD [place]

## 2016-10-25 LAB — COMPREHENSIVE METABOLIC PANEL
AG RATIO: 1.8 (calc) (ref 1.0–2.5)
ALBUMIN MSPROF: 4.7 g/dL (ref 3.6–5.1)
ALT: 38 U/L (ref 9–46)
AST: 23 U/L (ref 10–40)
Alkaline phosphatase (APISO): 77 U/L (ref 40–115)
BUN: 17 mg/dL (ref 7–25)
CHLORIDE: 104 mmol/L (ref 98–110)
CO2: 31 mmol/L (ref 20–32)
Calcium: 9.7 mg/dL (ref 8.6–10.3)
Creat: 1.05 mg/dL (ref 0.60–1.35)
GLOBULIN: 2.6 g/dL (ref 1.9–3.7)
GLUCOSE: 134 mg/dL — AB (ref 65–99)
POTASSIUM: 3.9 mmol/L (ref 3.5–5.3)
SODIUM: 140 mmol/L (ref 135–146)
TOTAL PROTEIN: 7.3 g/dL (ref 6.1–8.1)
Total Bilirubin: 0.5 mg/dL (ref 0.2–1.2)

## 2016-10-25 LAB — CK: Total CK: 103 U/L (ref 44–196)

## 2016-10-25 LAB — LDL CHOLESTEROL, DIRECT: Direct LDL: 86 mg/dL (ref <100)

## 2016-10-27 ENCOUNTER — Encounter: Payer: Self-pay | Admitting: Family Medicine

## 2016-10-27 ENCOUNTER — Other Ambulatory Visit: Payer: Self-pay | Admitting: Family Medicine

## 2016-10-27 ENCOUNTER — Telehealth: Payer: Self-pay | Admitting: Family Medicine

## 2016-10-27 ENCOUNTER — Ambulatory Visit: Payer: Self-pay | Admitting: Family Medicine

## 2016-10-27 MED ORDER — ROSUVASTATIN CALCIUM 20 MG PO TABS: 20.0000 mg | ORAL_TABLET | Freq: Every day | ORAL | 1 refills | Status: AC

## 2016-10-27 NOTE — Telephone Encounter (Signed)
Pt called back returning your call. Thank you! °

## 2016-10-27 NOTE — Telephone Encounter (Signed)
See result note.  

## 2016-11-03 DIAGNOSIS — D485 Neoplasm of uncertain behavior of skin: Secondary | ICD-10-CM | POA: Diagnosis not present

## 2016-11-03 DIAGNOSIS — D229 Melanocytic nevi, unspecified: Secondary | ICD-10-CM | POA: Diagnosis not present

## 2016-11-28 ENCOUNTER — Telehealth: Payer: Self-pay

## 2016-11-28 NOTE — Telephone Encounter (Signed)
Copied from [place] [id number]. Topic: Inquiry >> Nov 28, 2016  3:37 PM Oliver Pila B wrote: Reason for CRM: PT called in b/c he is suppose to have an order for him at the practice but he has recently moved to [place], [place] and is wanting to get those lab orders so that he can do them at [facility] in [place], the lab order is where the test will check kidney function due to the medication he's on (crestor), if possible email the order to [email or web address], please call pt to advise if necessary

## 2016-12-02 NOTE — Telephone Encounter (Signed)
Sent [employer], unable to email

## 2016-12-03 NOTE — Telephone Encounter (Signed)
Mailed form.

## 2017-08-10 IMAGING — CR DG CHEST 2V
1 series · 2 of 2 positions shown · non-contrast
Comparison: CT 06/04/2014.

CLINICAL DATA: Cough.

EXAM:
CHEST  2 VIEW

[Series 1: w chest pa · 0.14mm/px · 2 of 2 slices shown]
[im 1/2]
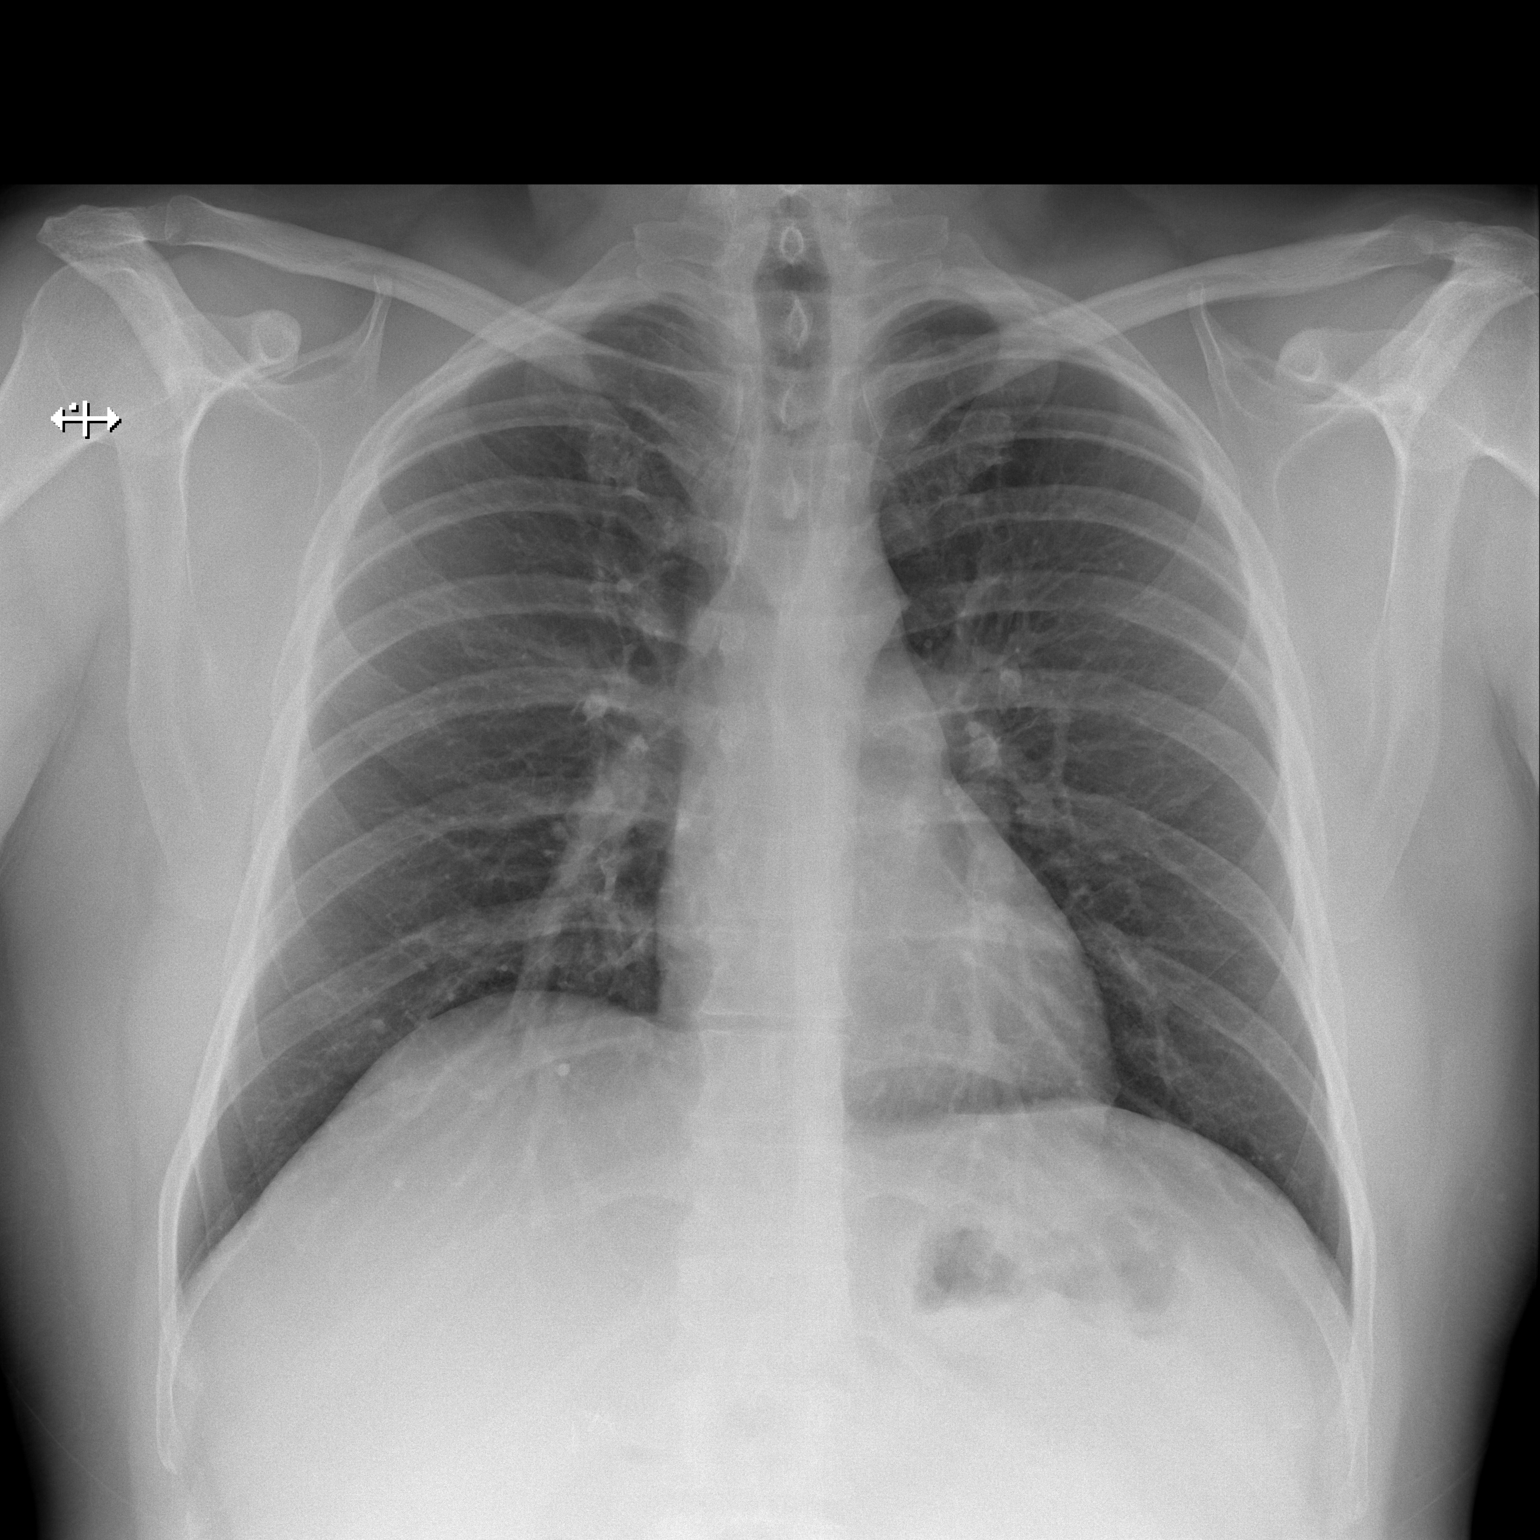
[im 2/2]
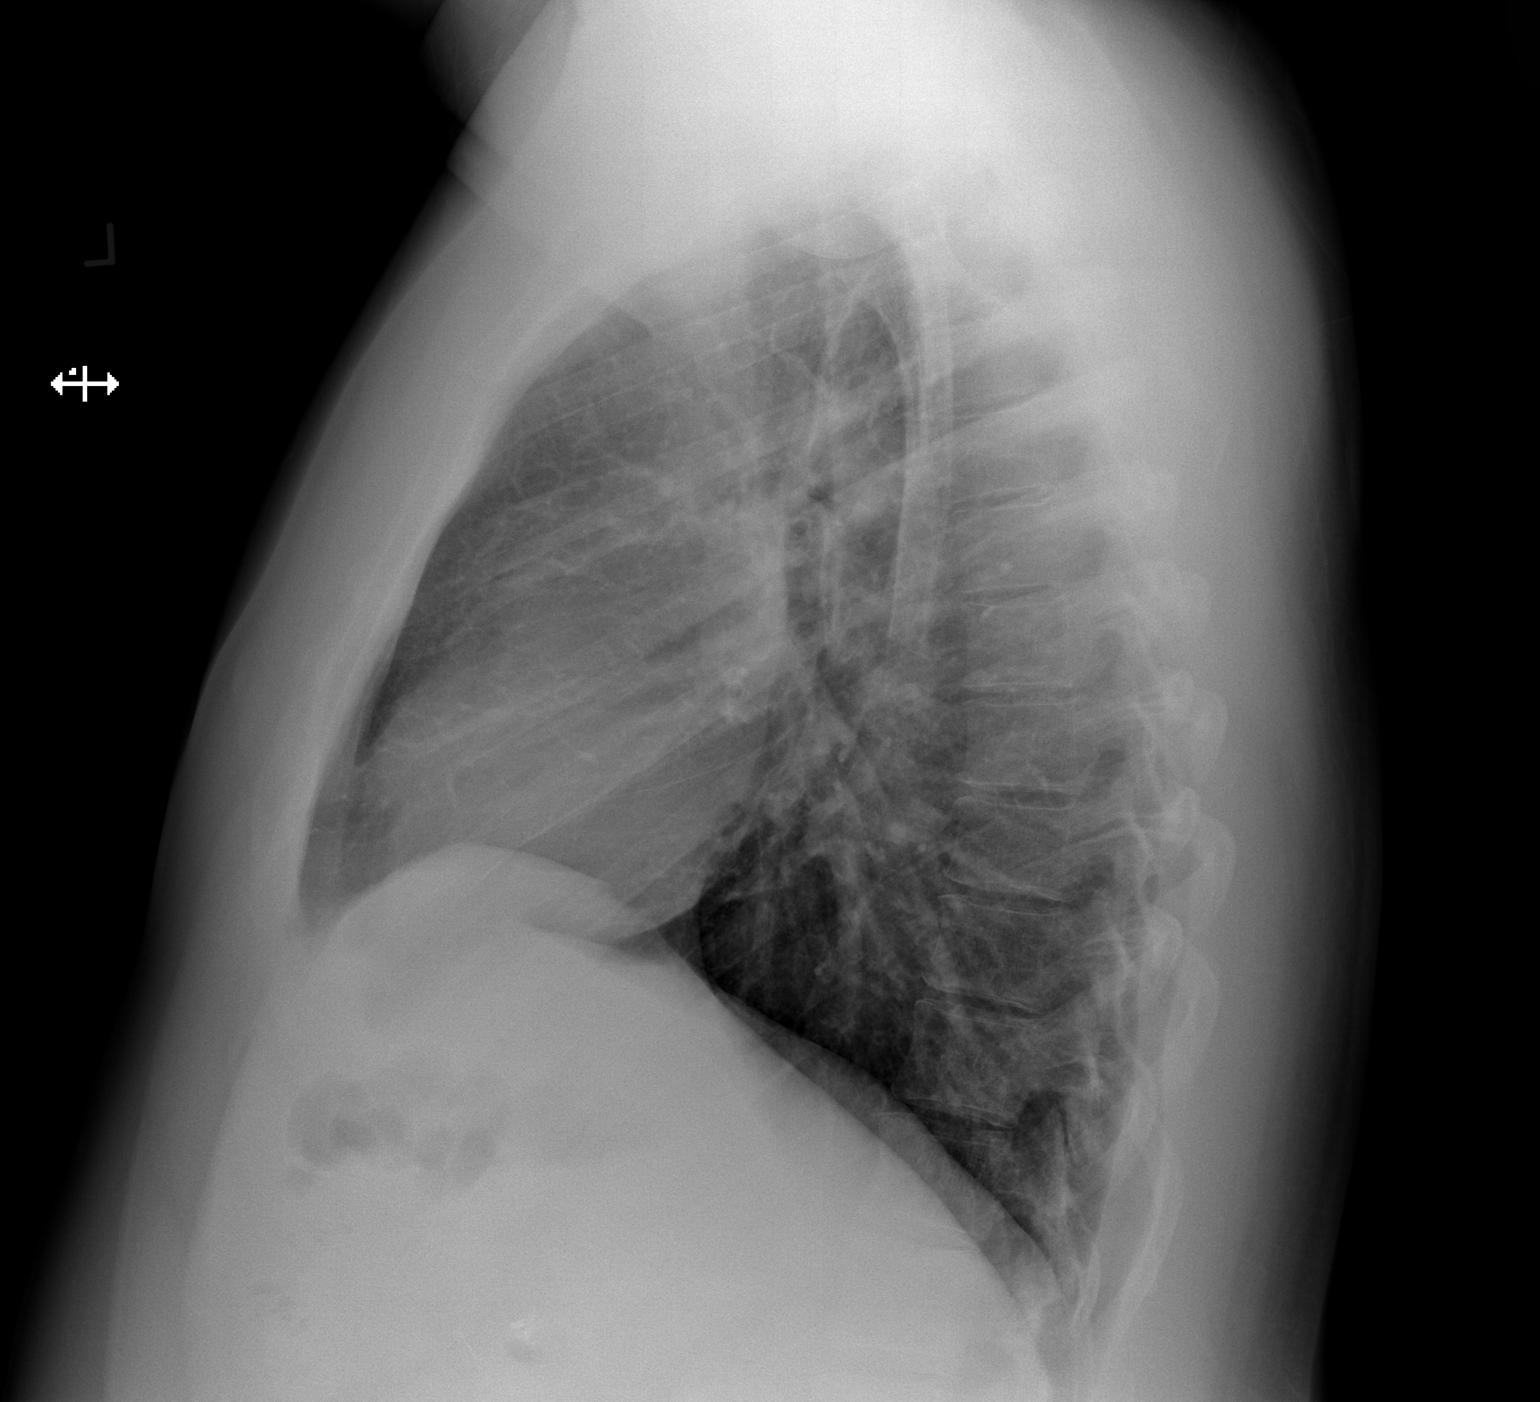

[2 of 2 positions shown; findings below may reference images not displayed]

FINDINGS: Mediastinum and hilar structures normal. Lungs are clear. Heart size
normal. No pleural effusion or pneumothorax .
IMPRESSION: No acute cardiopulmonary disease.
